# Patient Record
Sex: Female | Born: 1955
Health system: Southern US, Community
[De-identification: ages and names within clinical notes are randomized; demographics above are authoritative.]

## PROBLEM LIST (undated history)

## (undated) DIAGNOSIS — H269 Unspecified cataract: Secondary | ICD-10-CM

## (undated) DIAGNOSIS — E119 Type 2 diabetes mellitus without complications: Secondary | ICD-10-CM

## (undated) DIAGNOSIS — T7840XA Allergy, unspecified, initial encounter: Secondary | ICD-10-CM

## (undated) DIAGNOSIS — N200 Calculus of kidney: Secondary | ICD-10-CM

## (undated) DIAGNOSIS — G4700 Insomnia, unspecified: Secondary | ICD-10-CM

## (undated) DIAGNOSIS — K8020 Calculus of gallbladder without cholecystitis without obstruction: Secondary | ICD-10-CM

## (undated) DIAGNOSIS — M1990 Unspecified osteoarthritis, unspecified site: Secondary | ICD-10-CM

## (undated) DIAGNOSIS — K219 Gastro-esophageal reflux disease without esophagitis: Secondary | ICD-10-CM

## (undated) DIAGNOSIS — E079 Disorder of thyroid, unspecified: Secondary | ICD-10-CM

## (undated) DIAGNOSIS — E785 Hyperlipidemia, unspecified: Secondary | ICD-10-CM

## (undated) DIAGNOSIS — K5730 Diverticulosis of large intestine without perforation or abscess without bleeding: Secondary | ICD-10-CM

## (undated) HISTORY — DX: Insomnia, unspecified: G47.00

## (undated) HISTORY — DX: Unspecified cataract: H26.9

## (undated) HISTORY — DX: Gastro-esophageal reflux disease without esophagitis: K21.9

## (undated) HISTORY — DX: Diverticulosis of large intestine without perforation or abscess without bleeding: K57.30

## (undated) HISTORY — DX: Calculus of gallbladder without cholecystitis without obstruction: K80.20

## (undated) HISTORY — DX: Allergy, unspecified, initial encounter: T78.40XA

## (undated) HISTORY — DX: Type 2 diabetes mellitus without complications: E11.9

## (undated) HISTORY — DX: Disorder of thyroid, unspecified: E07.9

## (undated) HISTORY — PX: ABDOMINAL HYSTERECTOMY: SHX81

## (undated) HISTORY — PX: TONSILLECTOMY AND ADENOIDECTOMY: SUR1326

## (undated) HISTORY — DX: Unspecified osteoarthritis, unspecified site: M19.90

## (undated) HISTORY — DX: Hyperlipidemia, unspecified: E78.5

## (undated) HISTORY — DX: Calculus of kidney: N20.0

## (undated) HISTORY — PX: COLONOSCOPY: SHX174

## (undated) HISTORY — PX: CATARACT EXTRACTION: SUR2

---

## 1994-11-26 HISTORY — PX: OTHER SURGICAL HISTORY: SHX169

## 1999-08-25 ENCOUNTER — Other Ambulatory Visit: Admission: RE | Admit: 1999-08-25 | Discharge: 1999-08-25 | Payer: Self-pay | Admitting: Gynecology

## 1999-12-14 ENCOUNTER — Encounter: Admission: RE | Admit: 1999-12-14 | Discharge: 1999-12-14 | Payer: Self-pay | Admitting: Gynecology

## 1999-12-14 ENCOUNTER — Encounter: Payer: Self-pay | Admitting: Gynecology

## 1999-12-18 ENCOUNTER — Encounter: Payer: Self-pay | Admitting: Gynecology

## 1999-12-18 ENCOUNTER — Encounter: Admission: RE | Admit: 1999-12-18 | Discharge: 1999-12-18 | Payer: Self-pay | Admitting: Gynecology

## 2000-12-28 ENCOUNTER — Encounter: Payer: Self-pay | Admitting: Gynecology

## 2000-12-28 ENCOUNTER — Encounter: Admission: RE | Admit: 2000-12-28 | Discharge: 2000-12-28 | Payer: Self-pay | Admitting: Gynecology

## 2000-12-30 ENCOUNTER — Encounter: Payer: Self-pay | Admitting: Gynecology

## 2000-12-30 ENCOUNTER — Encounter: Admission: RE | Admit: 2000-12-30 | Discharge: 2000-12-30 | Payer: Self-pay | Admitting: Gynecology

## 2001-01-09 ENCOUNTER — Other Ambulatory Visit: Admission: RE | Admit: 2001-01-09 | Discharge: 2001-01-09 | Payer: Self-pay | Admitting: Gynecology

## 2002-01-05 ENCOUNTER — Encounter: Admission: RE | Admit: 2002-01-05 | Discharge: 2002-01-05 | Payer: Self-pay | Admitting: Gynecology

## 2002-01-05 ENCOUNTER — Encounter: Payer: Self-pay | Admitting: Gynecology

## 2002-01-15 ENCOUNTER — Other Ambulatory Visit: Admission: RE | Admit: 2002-01-15 | Discharge: 2002-01-15 | Payer: Self-pay | Admitting: Gynecology

## 2003-01-10 ENCOUNTER — Encounter: Admission: RE | Admit: 2003-01-10 | Discharge: 2003-01-10 | Payer: Self-pay | Admitting: Gynecology

## 2003-01-10 ENCOUNTER — Encounter: Payer: Self-pay | Admitting: Gynecology

## 2003-01-28 ENCOUNTER — Other Ambulatory Visit: Admission: RE | Admit: 2003-01-28 | Discharge: 2003-01-28 | Payer: Self-pay | Admitting: Gynecology

## 2004-03-24 ENCOUNTER — Encounter: Admission: RE | Admit: 2004-03-24 | Discharge: 2004-03-24 | Payer: Self-pay | Admitting: Gynecology

## 2004-08-11 ENCOUNTER — Other Ambulatory Visit: Admission: RE | Admit: 2004-08-11 | Discharge: 2004-08-11 | Payer: Self-pay | Admitting: Gynecology

## 2005-01-14 ENCOUNTER — Other Ambulatory Visit: Admission: RE | Admit: 2005-01-14 | Discharge: 2005-01-14 | Payer: Self-pay | Admitting: Gynecology

## 2005-02-25 HISTORY — PX: OTHER SURGICAL HISTORY: SHX169

## 2005-04-07 ENCOUNTER — Ambulatory Visit (HOSPITAL_BASED_OUTPATIENT_CLINIC_OR_DEPARTMENT_OTHER): Admission: RE | Admit: 2005-04-07 | Discharge: 2005-04-07 | Payer: Self-pay | Admitting: Orthopedic Surgery

## 2005-04-07 ENCOUNTER — Ambulatory Visit (HOSPITAL_COMMUNITY): Admission: RE | Admit: 2005-04-07 | Discharge: 2005-04-07 | Payer: Self-pay | Admitting: Orthopedic Surgery

## 2005-08-24 ENCOUNTER — Other Ambulatory Visit: Admission: RE | Admit: 2005-08-24 | Discharge: 2005-08-24 | Payer: Self-pay | Admitting: Gynecology

## 2005-09-28 ENCOUNTER — Encounter: Admission: RE | Admit: 2005-09-28 | Discharge: 2005-09-28 | Payer: Self-pay | Admitting: Gynecology

## 2005-10-06 ENCOUNTER — Encounter: Payer: Self-pay | Admitting: Gastroenterology

## 2006-03-07 ENCOUNTER — Other Ambulatory Visit: Admission: RE | Admit: 2006-03-07 | Discharge: 2006-03-07 | Payer: Self-pay | Admitting: Gynecology

## 2006-05-06 ENCOUNTER — Ambulatory Visit: Payer: Self-pay | Admitting: Family Medicine

## 2006-09-05 ENCOUNTER — Ambulatory Visit: Payer: Self-pay | Admitting: Family Medicine

## 2006-09-12 ENCOUNTER — Other Ambulatory Visit: Admission: RE | Admit: 2006-09-12 | Discharge: 2006-09-12 | Payer: Self-pay | Admitting: Gynecology

## 2006-11-04 ENCOUNTER — Encounter: Admission: RE | Admit: 2006-11-04 | Discharge: 2006-11-04 | Payer: Self-pay | Admitting: Gynecology

## 2007-07-26 DIAGNOSIS — R87619 Unspecified abnormal cytological findings in specimens from cervix uteri: Secondary | ICD-10-CM | POA: Insufficient documentation

## 2007-07-26 DIAGNOSIS — E785 Hyperlipidemia, unspecified: Secondary | ICD-10-CM | POA: Insufficient documentation

## 2007-07-26 DIAGNOSIS — K5730 Diverticulosis of large intestine without perforation or abscess without bleeding: Secondary | ICD-10-CM | POA: Insufficient documentation

## 2007-08-10 ENCOUNTER — Ambulatory Visit: Payer: Self-pay | Admitting: Family Medicine

## 2007-08-10 LAB — CONVERTED CEMR LAB
ALT: 18 units/L (ref 0–35)
AST: 18 units/L (ref 0–37)
Albumin: 3.9 g/dL (ref 3.5–5.2)
Alkaline Phosphatase: 91 units/L (ref 39–117)
BUN: 12 mg/dL (ref 6–23)
HDL: 33.5 mg/dL — ABNORMAL LOW (ref >39.0)
Potassium: 4.3 meq/L (ref 3.5–5.1)
Total Bilirubin: 0.8 mg/dL (ref 0.3–1.2)
VLDL: 27 mg/dL (ref 0–40)

## 2007-08-17 ENCOUNTER — Ambulatory Visit: Payer: Self-pay | Admitting: Family Medicine

## 2007-11-07 ENCOUNTER — Ambulatory Visit: Payer: Self-pay | Admitting: Family Medicine

## 2007-11-17 ENCOUNTER — Encounter: Admission: RE | Admit: 2007-11-17 | Discharge: 2007-11-17 | Payer: Self-pay | Admitting: Gynecology

## 2007-12-28 ENCOUNTER — Ambulatory Visit: Payer: Self-pay | Admitting: Family Medicine

## 2008-08-02 ENCOUNTER — Encounter: Payer: Self-pay | Admitting: Family Medicine

## 2008-09-10 ENCOUNTER — Ambulatory Visit: Payer: Self-pay | Admitting: Family Medicine

## 2008-09-10 DIAGNOSIS — J019 Acute sinusitis, unspecified: Secondary | ICD-10-CM | POA: Insufficient documentation

## 2008-11-19 ENCOUNTER — Encounter: Admission: RE | Admit: 2008-11-19 | Discharge: 2008-11-19 | Payer: Self-pay | Admitting: Gynecology

## 2008-12-25 ENCOUNTER — Ambulatory Visit: Payer: Self-pay | Admitting: Family Medicine

## 2009-12-26 ENCOUNTER — Encounter: Admission: RE | Admit: 2009-12-26 | Discharge: 2009-12-26 | Payer: Self-pay | Admitting: Gynecology

## 2010-04-30 ENCOUNTER — Encounter (INDEPENDENT_AMBULATORY_CARE_PROVIDER_SITE_OTHER): Payer: Self-pay | Admitting: *Deleted

## 2010-10-15 ENCOUNTER — Encounter: Payer: Self-pay | Admitting: Gastroenterology

## 2010-10-29 NOTE — Letter (Signed)
Summary: Sharon Benjamin letter  [facility]  [address] [place], [place] [address]   Phone: [phone]  Fax: [phone]       04/30/2010 MRN: [id number]  Sharon Benjamin [address] [place], [place]  [address]  Dear Ms. [name] [place], and [place] announce the retirement of Arta Silence, M.D., from full-time practice at the [facility] office effective March 26, 2010 and his plans of returning part-time.  It is important to Dr. Hetty Ely and to our practice that you understand that [facility] has seven physicians in our office for your health care needs.  We will continue to offer the same exceptional care that you have today.    Dr. Hetty Ely has spoken to many of you about his plans for retirement and returning part-time in the fall.   We will continue to work with you through the transition to schedule appointments for you in the office and meet the high standards that [place] is committed to.   Again, it is with great pleasure that we share the news that Dr. Hetty Ely will return to [facility] in October of 2011 with a reduced schedule.    If you have any questions, or would like to request an appointment with one of our physicians, please call us at [phone] and press the option for Scheduling an appointment.  We take pleasure in providing you with excellent patient care and look forward to seeing you at your next office visit.  Our [facility] Physicians are:  Tillman Abide, M.D. Laurita Quint, M.D. Roxy Manns, M.D. Kerby Nora, M.D. Hannah Beat, M.D. Ruthe Mannan, M.D. We proudly welcomed Raechel Ache, M.D. and Eustaquio Boyden, M.D. to the practice in July/August 2011.  Sincerely,  [facility]

## 2010-10-29 NOTE — Letter (Signed)
Summary: Pre Visit Letter Revised  [facility]  [address] [place], [place] [address]   Phone: [phone]  Fax: [phone]        10/15/2010 MRN: [id number]  Sharon Benjamin [address] [place], [place]  [address]             Procedure Date:  11-30-10 9:30am           Dr Russella Dar  - Recall Colon   Welcome to the Gastroenterology Division at [facility].    You are scheduled to see a nurse for your pre-procedure visit on 11-13-10 at 10am on the 3rd floor at [facility], [address]  We ask that you try to arrive at our office 15 minutes prior to your appointment time to allow for check-in.  Please take a minute to review the attached form.  If you answer "Yes" to one or more of the questions on the first page, we ask that you call the person listed at your earliest opportunity.  If you answer "No" to all of the questions, please complete the rest of the form and bring it to your appointment.    Your nurse visit will consist of discussing your medical and surgical history, your immediate family medical history, and your medications.   If you are unable to list all of your medications on the form, please bring the medication bottles to your appointment and we will list them.  We will need to be aware of both prescribed and over the counter drugs.  We will need to know exact dosage information as well.    Please be prepared to read and sign documents such as consent forms, a financial agreement, and acknowledgement forms.  If necessary, and with your consent, a friend or relative is welcome to sit-in on the nurse visit with you.  Please bring your insurance card so that we may make a copy of it.  If your insurance requires a referral to see a specialist, please bring your referral form from your primary care physician.  No co-pay is required for this nurse visit.     If you cannot keep your appointment, please call [phone] to cancel or reschedule prior  to your appointment date.  This allows [place] the opportunity to schedule an appointment for another patient in need of care.    Thank you for choosing [facility] for your medical needs.  We appreciate the opportunity to care for you.  Please visit [place] at our website  to learn more about our practice.  Sincerely, The Gastroenterology Division

## 2010-11-12 ENCOUNTER — Encounter (INDEPENDENT_AMBULATORY_CARE_PROVIDER_SITE_OTHER): Payer: Self-pay | Admitting: *Deleted

## 2010-11-13 ENCOUNTER — Encounter (INDEPENDENT_AMBULATORY_CARE_PROVIDER_SITE_OTHER): Payer: Self-pay | Admitting: *Deleted

## 2010-11-18 NOTE — Letter (Signed)
Summary: Moviprep Instructions  [facility]  [address]   [place], [place] [address]   Phone: [phone]  Fax: [phone]       Sharon Benjamin    [birth year]    MRN: [id number]        Procedure Day /Date: Monday, 11-30-10     Arrival Time: 8:30 a.m.     Procedure Time: 9:30 a.m.     Location of Procedure:                    x  [facility] (4th Floor)  PREPARATION FOR COLONOSCOPY WITH MOVIPREP   Starting 5 days prior to your procedure 11-25-10 do not eat nuts, seeds, popcorn, corn, beans, peas,  salads, or any raw vegetables.  Do not take any fiber supplements (e.g. Metamucil, Citrucel, and Benefiber).  THE DAY BEFORE YOUR PROCEDURE         DATE: 11-29-10  DAY: Sunday  1.  Drink clear liquids the entire day-NO SOLID FOOD  2.  Do not drink anything colored red or purple.  Avoid juices with pulp.  No orange juice.  3.  Drink at least 64 oz. (8 glasses) of fluid/clear liquids during the day to prevent dehydration and help the prep work efficiently.  CLEAR LIQUIDS INCLUDE: Water Jello Ice Popsicles Tea (sugar ok, no milk/cream) Powdered fruit flavored drinks Coffee (sugar ok, no milk/cream) Gatorade Juice: apple, white grape, white cranberry  Lemonade Clear bullion, consomm, broth Carbonated beverages (any kind) Strained chicken noodle soup Hard Candy                             4.  In the morning, mix first dose of MoviPrep solution:    Empty 1 Pouch A and 1 Pouch B into the disposable container    Add lukewarm drinking water to the top line of the container. Mix to dissolve    Refrigerate (mixed solution should be used within 24 hrs)  5.  Begin drinking the prep at 5:00 p.m. The MoviPrep container is divided by 4 marks.   Every 15 minutes drink the solution down to the next mark (approximately 8 oz) until the full liter is complete.   6.  Follow completed prep with 16 oz of clear liquid of your choice (Nothing red or purple).   Continue to drink clear liquids until bedtime.  7.  Before going to bed, mix second dose of MoviPrep solution:    Empty 1 Pouch A and 1 Pouch B into the disposable container    Add lukewarm drinking water to the top line of the container. Mix to dissolve    Refrigerate  THE DAY OF YOUR PROCEDURE      DATE: 11-30-10 DAY: Monday  Beginning at 4:30 a.m. (5 hours before procedure):         1. Every 15 minutes, drink the solution down to the next mark (approx 8 oz) until the full liter is complete.  2. Follow completed prep with 16 oz. of clear liquid of your choice.    3. You may drink clear liquids until 7:30 a.m. (2 HOURS BEFORE PROCEDURE).   MEDICATION INSTRUCTIONS  Unless otherwise instructed, you should take regular prescription medications with a small sip of water   as early as possible the morning of your procedure.       OTHER INSTRUCTIONS  You will need a responsible adult at least 57 years of age  to accompany you and drive you home.   This person must remain in the waiting room during your procedure.  Wear loose fitting clothing that is easily removed.  Leave jewelry and other valuables at home.  However, you may wish to bring a book to read or  an iPod/MP3 player to listen to music as you wait for your procedure to start.  Remove all body piercing jewelry and leave at home.  Total time from sign-in until discharge is approximately 2-3 hours.  You should go home directly after your procedure and rest.  You can resume normal activities the  day after your procedure.  The day of your procedure you should not:   Drive   Make legal decisions   Operate machinery   Drink alcohol   Return to work  You will receive specific instructions about eating, activities and medications before you leave.    The above instructions have been reviewed and explained to me by   Durwin Glaze RN  November 13, 2010 10:16 AM    I fully understand and can verbalize these  instructions _____________________________ Date _________

## 2010-11-18 NOTE — Miscellaneous (Signed)
Summary: [facility] PV/[facility]  Clinical Lists Changes  Medications: Added new medication of MOVIPREP 100 GM  SOLR (PEG-KCL-NACL-NASULF-NA ASC-C) As per prep instructions. - Signed Rx of MOVIPREP 100 GM  SOLR (PEG-KCL-NACL-NASULF-NA ASC-C) As per prep instructions.;  #1 x 0;  Signed;  Entered by: Durwin Glaze RN;  Authorized by: Meryl Dare MD [facility];  Method used: Print then Give to Patient Observations: Added new observation of NKA: T (11/13/2010 10:15)    Prescriptions: MOVIPREP 100 GM  SOLR (PEG-KCL-NACL-NASULF-NA ASC-C) As per prep instructions.  #1 x 0   Entered by:   Durwin Glaze RN   Authorized by:   Meryl Dare MD [facility]   Signed by:   Durwin Glaze RN on 11/13/2010   Method used:   Print then Give to Patient   RxID:   [id number]

## 2010-11-18 NOTE — Miscellaneous (Signed)
Summary: NO BLD THNRS COLON 3-5 9:30AM MONDAY LAST COLON 42YRS AGO W/DR...  Clinical Lists Changes

## 2010-11-24 ENCOUNTER — Telehealth (INDEPENDENT_AMBULATORY_CARE_PROVIDER_SITE_OTHER): Payer: Self-pay

## 2010-11-30 ENCOUNTER — Other Ambulatory Visit: Payer: Self-pay | Admitting: Gastroenterology

## 2010-12-03 NOTE — Procedures (Signed)
Summary: Colonoscopy/[facility]  Colonoscopy/[facility]   Imported By: Sherian Rein 11/27/2010 06:35:00  _____________________________________________________________________  External Attachment:    Type:   Image     Comment:   External Document

## 2010-12-03 NOTE — Progress Notes (Signed)
Summary: Colon recall ?  Phone Note Other Incoming   Summary of Call: Patient was here for a pre-visit.  She had a colon 10/06/05 with Dr Kinnie Scales.  He sent her a recall letter this year, due to insurance she has come here.  Please review records and advise if colon scheduled for 3/15 is still appropriate.   Initial call taken by: Darcey Nora RN, CGRN,  November 24, 2010 11:01 AM  Follow-up for Phone Call        Records on my desk? Can't find any records in EMR? Follow-up by: Meryl Dare MD Clementeen Graham,  November 24, 2010 11:34 AM  Additional Follow-up for Phone Call Additional follow up Details #1::        yes they are on paper on your desk. Darcey Nora RN, [facility]  November 24, 2010 11:37 AM  I spoke with the patient and she has no family hx or symproms.  She is aware that colon is due 09/2015.  I have canceled her appointment for colon and entered future recal;l. Additional Follow-up by: Darcey Nora RN, CGRN,  November 25, 2010 11:06 AM    Additional Follow-up for Phone Call Additional follow up Details #2::    10/06/05 colonoscopy report shows no elevated risk factors and there were no polyps noted. If she does not have elevated risk factors or other GI symptoms at this time, a 10 year recall for 09/2015 would be indicated.. Follow-up by: Meryl Dare MD Clementeen Graham,  November 24, 2010 5:19 PM

## 2010-12-04 ENCOUNTER — Other Ambulatory Visit: Payer: Self-pay | Admitting: Gynecology

## 2010-12-04 DIAGNOSIS — Z1231 Encounter for screening mammogram for malignant neoplasm of breast: Secondary | ICD-10-CM

## 2011-01-08 ENCOUNTER — Ambulatory Visit
Admission: RE | Admit: 2011-01-08 | Discharge: 2011-01-08 | Disposition: A | Discharge status: Home / Self Care | Payer: Managed Care, Other (non HMO) | Source: Ambulatory Visit | Attending: Gynecology | Admitting: Gynecology

## 2011-01-08 DIAGNOSIS — Z1231 Encounter for screening mammogram for malignant neoplasm of breast: Secondary | ICD-10-CM

## 2011-02-12 NOTE — Assessment & Plan Note (Signed)
[facility]                             [place] OFFICE NOTE   Sharon Benjamin                     MRN:          [id number]  DATE:05/06/2006                            DOB:          1956/09/16    [name] is a 55 year old white female who comes to establish with the  practice having previously seen Dr. Karma Ganja who has retired.  She sees  Dr. Nicholas Lose in [place], [place].   PROBLEMS:  She comes for two problems.  1. The first being a right foot pain time 3-4 days with no trauma.  It      hurts down the lateral side of the foot with some slight amount of      swelling.  She has been taking nothing.  2. The second problem is her tail bone hurts for the past two months.      She again has had no trauma.   CURRENT MEDICATIONS:  1. Zetia 10 mg daily.  2. Ambien CR 6.25 mg at h.s. p.r.n.  3. Vivelle patch 0.05 mg.   ALLERGIES:  No known.   PAST MEDICAL HISTORY:  1. Significant for hyperlipidemia.  Note she did not tolerate Lipitor.  2. Abnormal Pap.  Now is having Pap's every six months as followed by Dr.      Nicholas Lose.  3. She is on hormone replacement.  4. She has had no measles, mumps or chickenpox.  5. She has diverticulosis and had a renal stone about 1990.   SURGERIES:  Surgeries having included left knee arthroscopic by Dr. Eulah Pont  and a T&A at age 69.   HOSPITALIZATIONS:  She has been hospitalized for surgery only.   SOCIAL HISTORY:  She is married without children.  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  She indicates that she does not smoke.  Seldom uses  alcohol.   REVIEW OF SYSTEMS:  Negative including HENT, cardiovascular, respiratory.  She does wear contacts and glasses for near-sightedness.  Last eye exam was  less than a year ago with no glaucoma, cataracts and no new prescriptions.  GI:  She indicates that she has occasional constipation but controlled with  fruit and fiber.  She has occasional  reflux and she uses Ranitidine on a  p.r.n. basis.  She had a colonoscopy in January of 2007 at which time  diverticulosis was found.  This was done by Dr. Kinnie Scales.  GU:  Renal stones  as noted before 1990 with a negative IVP.  She has had no recurrence.  She  has had remote urinary tract infections.  Gynecologically, she has been  nullipara.  Last Pap smear was approximately a year ago and her mammogram  she indicates is current and is negative.  MUSCULOSKELETAL:  She has pain  in her left knee and arthritis in her hands and knees.  She has had no  thromboses but does have allergic rhinitis in the spring and fall.   FAMILY HISTORY:  Father died at age 77 with carcinoma of the esophagus.  He  was a smoker and used  alcohol.  Mother died at age 25, complications of  pneumonia.  She had hyperlipidemia, hypertension, asthma and psoriatic  arthritis.  She had one brother who died at age 31 in a motor vehicle  accident.  She has one sister living who has varicosities.   With questions regarding the extended family on that there is angina on the  mother's side of the family and a maternal uncle had congestive heart  failure and her maternal grandmother died of an MI.  Her paternal  grandmother has diabetes.  A paternal aunt had breast cancer and a maternal  aunt had ovarian cancer.  To her knowledge there is no colon or prostate  cancer in the family.  There paternal aunt had liver cancer and a paternal  uncle x2 have lung cancer.   IMMUNIZATION RECORDS:  She is unsure of her DT status.  She has not had the  hepatitis C and does not do the flu vaccine.   PHYSICAL EXAMINATION:  VITAL SIGNS:  Blood pressure  158/98, temperature  98.4, pulse is 76.  Weight is 206.  Height 5 feet 5 inches.  GENERAL:  Obese, white female in no acute distress.  CHEST:  Clear throughout.  No rales, rhonchi or wheezes.  HEART:  Rate and rhythm regular without murmurs, gallops or rubs, no  pretibial edema.   MUSCULOSKELETAL:  Muscle masses equal throughout upper and lower  extremities.  Gait and station within normal limits.  Right foot -- she is  tender along the lateral right foot.  No erythema, edema or ecchymosis. X-  ray was negative.  SACRAL/COCCYX AREA:  Exam where her pain indicated, she is tender slightly  over the lower sacral area with no erythema or edema noted in the skin  tissue.  SKIN:  Skin is as above and otherwise without lesions.  PSYCHIATRIC:  Oriented x3.  Verbalizes without difficulty.   ASSESSMENT:  1. Pain in the lower sacral area.  Question early pilonidal cyst.  Have      asked her to keep the area clean and dry and see back if she has      increased pain or swelling, as this may need surgical intervention.  2. Right lateral foot pain.  As the x-ray appears normal to me, I have      asked her to use ibuprofen 400-800 every 4-8 hours with food and we      will refer her if this continues.  3. Obesity.  This was not discussed in detail today.  4. We will get her old records and update her chart as needed.                                   Billie D. Bean, FNP                                Marne A. Tower, MD   BDB/MedQ  DD:  05/09/2006  DT:  05/09/2006  Job #:  [id number]

## 2011-02-12 NOTE — Op Note (Signed)
NAMEVIANNA, VENEZIA              ACCOUNT NO.:  [id number]   MEDICAL RECORD NO.:  [id number]          PATIENT TYPE:  AMB   LOCATION:  [facility]                          FACILITY:  [facility]   PHYSICIAN:  Loreta Ave, M.D. DATE OF BIRTH:  1956-04-13   DATE OF PROCEDURE:  04/08/2005  DATE OF DISCHARGE:                                 OPERATIVE REPORT   PREOPERATIVE DIAGNOSIS:  Left knee lateral meniscus tear.   POSTOPERATIVE DIAGNOSIS:  Left knee lateral meniscus tear with grade 2, mild  grade 3 chondromalacia, lateral patella facet and lateral tibial plateau.   OPERATION/PROCEDURE:  1.  Left knee examination under anesthesia.  2.  Arthroscopy.  3.  Partial lateral meniscectomy and chondroplasty of the lateral tibial      plateau and lateral patella.   SURGEON:  Loreta Ave, M.D.   ASSISTANT:  Genene Churn. Owens, P.A.-C.   ANESTHESIA:  Knee block with sedation.   SPECIMENS:  None.   CULTURES:  None.   COMPLICATIONS:  None.   DRESSINGS:  Soft compressive.   DESCRIPTION OF PROCEDURE:  The patient was brought to the operating room and  after adequate anesthesia had been obtained, left knee was examined.  Full  motion, stable ligaments.  Some lateral tracking and crepitus patellofemoral  joint but not significant tethering.  Full motion.  Tourniquet and leg  holder applied.  Leg prepped and draped in the usual sterile fashion.  Three  portals created.  Once superolateral, one each medial and lateral  parapatellar.  The inflow catheter induced.  Knee distended, arthroscope  introduced and the knee inspected.  Reasonable patellofemoral tracking, a  little lateral but no tethering.  Mild grade 3 chondromalacia, lateral facet  of the patella, treated with chondroplasty.  Fortunately not a lot of loss  articular cartilage.  Some small areas of focal grade 2 and 3 changes  in  the middle of the trochlea contacting the patella at 90 degrees.  Also  debrided.  Remaining knee examined.   Medial meniscus and medical compartment  intact with the exception of a little fraying of the free edge of the medial  meniscus was easily debrided and really did not represent a significant  tear.  Cruciate ligaments intact.  Laterally there were diffuse grade 2 and  3 changes on the plateau debrided.  Relatively extensive tearing of the  lateral meniscus, almost the entire anterior third with further flap tears  in the posterior attachment.  _________ out removing most of the anterior  half of the meniscus which was torn.  __________ remaining meniscus.  Flap  tear in the back debrided.  Retained about half the meniscus at completion.  Entire knee reexamined to be sure all loose  bodies were removed.  Instruments and fluid removed.  Portals of the knee  injected with Marcaine.  Portals closed with 4-0 nylon.  Sterile compressive  dressing was applied.  Anesthesia reversed.  Brought to the recovery room.  Tolerated the surgery well without complications.       DFM/MEDQ  D:  04/08/2005  T:  04/08/2005  Job:  [id number] 

## 2011-08-12 ENCOUNTER — Ambulatory Visit (INDEPENDENT_AMBULATORY_CARE_PROVIDER_SITE_OTHER): Payer: Managed Care, Other (non HMO) | Admitting: Family Medicine

## 2011-08-12 ENCOUNTER — Encounter: Payer: Self-pay | Admitting: Family Medicine

## 2011-08-12 DIAGNOSIS — J209 Acute bronchitis, unspecified: Secondary | ICD-10-CM | POA: Insufficient documentation

## 2011-08-12 MED ORDER — AZITHROMYCIN 250 MG PO TABS: ORAL_TABLET | ORAL | Status: AC

## 2011-08-12 NOTE — Assessment & Plan Note (Signed)
See instructions

## 2011-08-12 NOTE — Progress Notes (Signed)
  Subjective:    Patient ID: Sharon Benjamin, female    DOB: 21-Aug-1956, 55 y.o.   MRN: [id number]  HPI Pt not seen here in years here as acute appt for congestion, fever and cough. She sees Dr Nicholas Lose regularly and he prescribes her cholesterol medication. Her chol and liver function was checked a couple weeks ago. She has had fever to 101. Last night and took Tyl, none this AM. She has headache globally, even in her jaw and teeth. No ear pain but they itch. She has some ear drainage, no rhinitis but nasal congestion, no real ST except when coughing, coughing often productive of green thick mucous, no N/V, no diarrhea, no achiness.  She has taken tyl and has been using mucous relief expectorant, 1 a day for a few days She is exposed to smoke, does not smoke herself..     Review of SystemsNoncontributory except as above.       Objective:   Physical Exam  Constitutional: She appears well-developed and well-nourished. No distress.       Congested.  HENT:  Head: Normocephalic and atraumatic.  Right Ear: External ear normal.  Left Ear: External ear normal.  Mouth/Throat: Oropharynx is clear and moist. No oropharyngeal exudate.       Nose slightly inflamed with clear mucous.  Eyes: Conjunctivae and EOM are normal. Pupils are equal, round, and reactive to light.  Neck: Normal range of motion. Neck supple. No thyromegaly present.  Cardiovascular: Normal rate, regular rhythm and normal heart sounds.   Pulmonary/Chest: Effort normal and breath sounds normal. She has no wheezes. She has no rales.       Cough with airflow and mild ronchi bilaterally on exhalation.  Lymphadenopathy:    She has no cervical adenopathy.  Skin: She is not diaphoretic.          Assessment & Plan:

## 2011-08-12 NOTE — Patient Instructions (Signed)
Take Zithro. Take Guaifenesin (400mg ), take 11/2 tabs by mouth AM and NOON. Get GUAIFENESIN by  going to [facility] or [facility] and getting MUCOUS RELIEF EXPECTORANT/CONGESTION. DO NOT GET MUCINEX (Timed Release Guaifenesin)  Drink fluids. Gargle with 30ccs of warm salt water every half hour for 2 days as able. Take Advil 2 tabs after each meal for a few days. Keep lozenge in mouth all day.  Take Delsym at night as needed.

## 2011-10-28 ENCOUNTER — Ambulatory Visit (INDEPENDENT_AMBULATORY_CARE_PROVIDER_SITE_OTHER): Payer: Managed Care, Other (non HMO) | Admitting: Family Medicine

## 2011-10-28 ENCOUNTER — Encounter: Payer: Self-pay | Admitting: Family Medicine

## 2011-10-28 VITALS — BP 142/72 | HR 96 | Temp 98.4°F | Wt 218.0 lb

## 2011-10-28 DIAGNOSIS — J329 Chronic sinusitis, unspecified: Secondary | ICD-10-CM | POA: Insufficient documentation

## 2011-10-28 MED ORDER — AMOXICILLIN 875 MG PO TABS: 875.0000 mg | ORAL_TABLET | Freq: Two times a day (BID) | ORAL | Status: AC

## 2011-10-28 NOTE — Progress Notes (Signed)
duration of symptoms: last few days, started with burning in nose rhinorrhea:yes congestion:yes ear pain: no pain but itchy sore throat: scratchy Cough: minimal, clearing throat Myalgias: no other concerns: +facial pain and sore upper teeth, no fevers.   ROS: See HPI.  Otherwise negative.    Meds, vitals, and allergies reviewed.   GEN: nad, alert and oriented HEENT: mucous membranes moist, TM w/o erythema, nasal epithelium injected, OP with cobblestoning, max sinus ttp x2 NECK: supple w/o LA CV: rrr. PULM: ctab, no inc wob ABD: soft, +bs EXT: no edema

## 2011-10-28 NOTE — Patient Instructions (Signed)
Nasal saline and rest for next few days.  Use amoxil if not improved.  Take care.

## 2011-10-28 NOTE — Assessment & Plan Note (Addendum)
Short duration, nontoxic.  Use nasal saline and rest for next few days.  Use amoxil if not improved.  She agrees.  F/u prn.

## 2011-11-10 ENCOUNTER — Other Ambulatory Visit: Payer: Self-pay | Admitting: Family Medicine

## 2011-11-10 ENCOUNTER — Encounter: Payer: Self-pay | Admitting: Family Medicine

## 2011-11-10 ENCOUNTER — Telehealth: Payer: Self-pay | Admitting: Family Medicine

## 2011-11-10 DIAGNOSIS — H02849 Edema of unspecified eye, unspecified eyelid: Secondary | ICD-10-CM | POA: Insufficient documentation

## 2011-11-10 NOTE — Telephone Encounter (Signed)
Call pt.  Eye clinic rec'd checking ANA due to eyelid swelling.  Orders are in.  Needs to come for nonfasting lab visit.  Thanks.

## 2011-11-11 NOTE — Telephone Encounter (Signed)
LMOVM at home and work #'s to return call.

## 2011-11-11 NOTE — Telephone Encounter (Signed)
Appt 11/16/11 at 8:00.

## 2011-11-16 ENCOUNTER — Other Ambulatory Visit (INDEPENDENT_AMBULATORY_CARE_PROVIDER_SITE_OTHER): Payer: Managed Care, Other (non HMO)

## 2011-11-16 DIAGNOSIS — H02849 Edema of unspecified eye, unspecified eyelid: Secondary | ICD-10-CM

## 2011-12-22 ENCOUNTER — Other Ambulatory Visit: Payer: Self-pay | Admitting: Gynecology

## 2011-12-22 DIAGNOSIS — Z1231 Encounter for screening mammogram for malignant neoplasm of breast: Secondary | ICD-10-CM

## 2012-01-21 ENCOUNTER — Ambulatory Visit
Admission: RE | Admit: 2012-01-21 | Discharge: 2012-01-21 | Disposition: A | Discharge status: Home / Self Care | Payer: Managed Care, Other (non HMO) | Source: Ambulatory Visit | Attending: Gynecology | Admitting: Gynecology

## 2012-01-21 DIAGNOSIS — Z1231 Encounter for screening mammogram for malignant neoplasm of breast: Secondary | ICD-10-CM

## 2012-08-18 ENCOUNTER — Other Ambulatory Visit: Payer: Self-pay | Admitting: Internal Medicine

## 2012-08-18 DIAGNOSIS — E0590 Thyrotoxicosis, unspecified without thyrotoxic crisis or storm: Secondary | ICD-10-CM

## 2012-08-18 DIAGNOSIS — E049 Nontoxic goiter, unspecified: Secondary | ICD-10-CM

## 2012-08-29 ENCOUNTER — Encounter: Payer: Self-pay | Admitting: Family Medicine

## 2012-08-29 ENCOUNTER — Ambulatory Visit (INDEPENDENT_AMBULATORY_CARE_PROVIDER_SITE_OTHER): Payer: Managed Care, Other (non HMO) | Admitting: Family Medicine

## 2012-08-29 VITALS — BP 162/82 | HR 100 | Temp 100.1°F | Wt 225.0 lb

## 2012-08-29 DIAGNOSIS — R059 Cough, unspecified: Secondary | ICD-10-CM

## 2012-08-29 DIAGNOSIS — E042 Nontoxic multinodular goiter: Secondary | ICD-10-CM

## 2012-08-29 DIAGNOSIS — R05 Cough: Secondary | ICD-10-CM

## 2012-08-29 MED ORDER — ALBUTEROL SULFATE HFA 108 (90 BASE) MCG/ACT IN AERS: 2.0000 | INHALATION_SPRAY | Freq: Four times a day (QID) | RESPIRATORY_TRACT | Status: DC | PRN

## 2012-08-29 MED ORDER — AZITHROMYCIN 250 MG PO TABS: ORAL_TABLET | ORAL | Status: DC

## 2012-08-29 NOTE — Patient Instructions (Addendum)
Use the inhaler for the cough and start the antibiotics today.  This should gradually get better.  Rest and fluids in the meantime.  Take care.

## 2012-08-29 NOTE — Progress Notes (Signed)
duration of symptoms: just under a week.  Rhinorrhea:yes congestion:yes ear pain:yes sore throat: scratchy but not sore Cough: yes, but no sputum.  Wheeze at night.  Myalgias: no Fever: yes She has a sore on the L side of her mouth  Took delsym for the cough.  No help with that.    ROS: See HPI.  Otherwise negative.    Meds, vitals, and allergies reviewed.   GEN: nad, alert and oriented HEENT: mucous membranes moist, TM w/o erythema, nasal epithelium injected, OP with cobblestoning NECK: supple w/ tender LA CV: rrr. PULM: diffuse rhonchi B but no wheeze, no inc wob ABD: soft, +bs EXT: no edema

## 2012-08-30 DIAGNOSIS — R05 Cough: Secondary | ICD-10-CM | POA: Insufficient documentation

## 2012-08-30 DIAGNOSIS — E042 Nontoxic multinodular goiter: Secondary | ICD-10-CM | POA: Insufficient documentation

## 2012-08-30 DIAGNOSIS — R059 Cough, unspecified: Secondary | ICD-10-CM | POA: Insufficient documentation

## 2012-08-30 NOTE — Assessment & Plan Note (Signed)
Given the exam, would treat with zithromax, rest and fluids.  Should resolve, f/u prn. She agrees.  Nontoxic.

## 2012-09-05 ENCOUNTER — Encounter (HOSPITAL_COMMUNITY): Admission: RE | Admit: 2012-09-05 | Payer: Managed Care, Other (non HMO) | Source: Ambulatory Visit

## 2012-09-06 ENCOUNTER — Encounter (HOSPITAL_COMMUNITY)
Admission: RE | Admit: 2012-09-06 | Discharge: 2012-09-06 | Disposition: A | Discharge status: Home / Self Care | Payer: Managed Care, Other (non HMO) | Source: Ambulatory Visit | Attending: Internal Medicine | Admitting: Internal Medicine

## 2012-09-06 ENCOUNTER — Encounter (HOSPITAL_COMMUNITY): Payer: Managed Care, Other (non HMO)

## 2012-09-06 DIAGNOSIS — E0510 Thyrotoxicosis with toxic single thyroid nodule without thyrotoxic crisis or storm: Secondary | ICD-10-CM | POA: Insufficient documentation

## 2012-09-06 DIAGNOSIS — E0590 Thyrotoxicosis, unspecified without thyrotoxic crisis or storm: Secondary | ICD-10-CM

## 2012-09-06 DIAGNOSIS — E049 Nontoxic goiter, unspecified: Secondary | ICD-10-CM

## 2012-09-07 ENCOUNTER — Encounter (HOSPITAL_COMMUNITY)
Admission: RE | Admit: 2012-09-07 | Discharge: 2012-09-07 | Disposition: A | Discharge status: Home / Self Care | Payer: Managed Care, Other (non HMO) | Source: Ambulatory Visit | Attending: Internal Medicine | Admitting: Internal Medicine

## 2012-09-07 MED ORDER — SODIUM IODIDE I 131 CAPSULE
8.5000 | Freq: Once | INTRAVENOUS | Status: AC | PRN
  Administered 2012-09-07: 8.5 via ORAL

## 2012-09-07 MED ORDER — SODIUM PERTECHNETATE TC 99M INJECTION
10.1000 | Freq: Once | INTRAVENOUS | Status: AC | PRN
  Administered 2012-09-07: 10.1 via INTRAVENOUS

## 2012-09-15 ENCOUNTER — Other Ambulatory Visit: Payer: Self-pay | Admitting: Internal Medicine

## 2012-09-15 DIAGNOSIS — E0590 Thyrotoxicosis, unspecified without thyrotoxic crisis or storm: Secondary | ICD-10-CM

## 2012-09-15 DIAGNOSIS — E0520 Thyrotoxicosis with toxic multinodular goiter without thyrotoxic crisis or storm: Secondary | ICD-10-CM

## 2012-09-29 ENCOUNTER — Encounter (HOSPITAL_COMMUNITY)
Admission: RE | Admit: 2012-09-29 | Discharge: 2012-09-29 | Disposition: A | Discharge status: Home / Self Care | Payer: Managed Care, Other (non HMO) | Source: Ambulatory Visit | Attending: Internal Medicine | Admitting: Internal Medicine

## 2012-09-29 DIAGNOSIS — E0520 Thyrotoxicosis with toxic multinodular goiter without thyrotoxic crisis or storm: Secondary | ICD-10-CM | POA: Insufficient documentation

## 2012-09-29 DIAGNOSIS — E0590 Thyrotoxicosis, unspecified without thyrotoxic crisis or storm: Secondary | ICD-10-CM

## 2012-09-29 MED ORDER — SODIUM IODIDE I 131 CAPSULE: 31.1000 | Freq: Once | INTRAVENOUS | Status: AC | PRN

## 2012-12-06 DIAGNOSIS — D219 Benign neoplasm of connective and other soft tissue, unspecified: Secondary | ICD-10-CM | POA: Insufficient documentation

## 2012-12-07 DIAGNOSIS — E669 Obesity, unspecified: Secondary | ICD-10-CM | POA: Insufficient documentation

## 2013-02-09 ENCOUNTER — Other Ambulatory Visit: Payer: Self-pay

## 2013-02-09 DIAGNOSIS — Z1231 Encounter for screening mammogram for malignant neoplasm of breast: Secondary | ICD-10-CM

## 2013-03-15 ENCOUNTER — Ambulatory Visit
Admission: RE | Admit: 2013-03-15 | Discharge: 2013-03-15 | Disposition: A | Discharge status: Home / Self Care | Payer: Managed Care, Other (non HMO) | Source: Ambulatory Visit

## 2013-03-15 ENCOUNTER — Encounter: Payer: Self-pay | Admitting: *Deleted

## 2013-03-15 DIAGNOSIS — Z1231 Encounter for screening mammogram for malignant neoplasm of breast: Secondary | ICD-10-CM

## 2013-09-07 ENCOUNTER — Ambulatory Visit (INDEPENDENT_AMBULATORY_CARE_PROVIDER_SITE_OTHER): Payer: Managed Care, Other (non HMO) | Admitting: Family Medicine

## 2013-09-07 ENCOUNTER — Encounter: Payer: Self-pay | Admitting: Family Medicine

## 2013-09-07 VITALS — BP 146/72 | HR 91 | Temp 99.0°F | Ht 65.75 in | Wt 232.8 lb

## 2013-09-07 DIAGNOSIS — J019 Acute sinusitis, unspecified: Secondary | ICD-10-CM

## 2013-09-07 DIAGNOSIS — E785 Hyperlipidemia, unspecified: Secondary | ICD-10-CM

## 2013-09-07 MED ORDER — AMOXICILLIN-POT CLAVULANATE 875-125 MG PO TABS: 1.0000 | ORAL_TABLET | Freq: Two times a day (BID) | ORAL | Status: DC

## 2013-09-07 MED ORDER — SIMVASTATIN 20 MG PO TABS: 20.0000 mg | ORAL_TABLET | Freq: Every day | ORAL | Status: DC

## 2013-09-07 NOTE — Assessment & Plan Note (Signed)
D/w pt. Nontoxic.  Would treat given the exam.  Start augmentin. F/u prn. She agrees.

## 2013-09-07 NOTE — Patient Instructions (Signed)
Get some rest.  Drink plenty of fluids.  Start the augmentin today and this should resolve.  Take care.

## 2013-09-07 NOTE — Assessment & Plan Note (Signed)
Refill done to last until her next CPE.  Given to patient.

## 2013-09-07 NOTE — Progress Notes (Signed)
Pre-visit discussion using our clinic review tool. No additional management support is needed unless otherwise documented below in the visit note.  Sx started about 1 week ago. Progressive in the meantime.  Voice is altered.  Scratchy throat. Postnasal gtt. Fevers starting about 2 days ago. B ear itching but not pain. Cough. Some sputum.  Facial and B upper tooth pain.  No vomiting, no diarrhea.  Possible sick contacts. Had been out of work today, working from home.   Meds, vitals, and allergies reviewed.   ROS: See HPI.  Otherwise, noncontributory.  GEN: nad, alert and oriented HEENT: mucous membranes moist, tm w/o erythema, nasal exam w/o erythema, clear discharge noted,  OP with cobblestoning, max sinuses ttp B NECK: supple w/o LA CV: rrr.   PULM: ctab, no inc wob EXT: no edema SKIN: no acute rash

## 2013-11-22 ENCOUNTER — Other Ambulatory Visit: Payer: Managed Care, Other (non HMO)

## 2013-11-26 ENCOUNTER — Other Ambulatory Visit: Payer: Managed Care, Other (non HMO)

## 2013-11-29 ENCOUNTER — Ambulatory Visit (INDEPENDENT_AMBULATORY_CARE_PROVIDER_SITE_OTHER): Payer: Managed Care, Other (non HMO) | Admitting: Family Medicine

## 2013-11-29 ENCOUNTER — Encounter: Payer: Self-pay | Admitting: Family Medicine

## 2013-11-29 VITALS — BP 152/84 | HR 80 | Temp 98.3°F | Ht 65.0 in | Wt 236.5 lb

## 2013-11-29 DIAGNOSIS — Z0000 Encounter for general adult medical examination without abnormal findings: Secondary | ICD-10-CM

## 2013-11-29 DIAGNOSIS — R05 Cough: Secondary | ICD-10-CM

## 2013-11-29 DIAGNOSIS — E785 Hyperlipidemia, unspecified: Secondary | ICD-10-CM

## 2013-11-29 DIAGNOSIS — R059 Cough, unspecified: Secondary | ICD-10-CM

## 2013-11-29 DIAGNOSIS — Z23 Encounter for immunization: Secondary | ICD-10-CM

## 2013-11-29 DIAGNOSIS — R454 Irritability and anger: Secondary | ICD-10-CM

## 2013-11-29 MED ORDER — CITALOPRAM HYDROBROMIDE 20 MG PO TABS: 20.0000 mg | ORAL_TABLET | Freq: Every day | ORAL | Status: DC

## 2013-11-29 MED ORDER — RANITIDINE HCL 150 MG PO TABS
150.0000 mg | ORAL_TABLET | Freq: Two times a day (BID) | ORAL | Status: DC
Start: 2013-11-29 — End: 2015-01-03

## 2013-11-29 NOTE — Progress Notes (Signed)
Pre visit review using our clinic review tool, if applicable. No additional management support is needed unless otherwise documented below in the visit note.  Routine anticipatory guidance given to patient.  See health maintenance. Flu encouraged.  Done at work, fall 2014 Shingles due at 25.   PNA due at 63 Tetanus today.   Colon 2017 Breast cancer screening- mammogram 2014 Breast exam done at GYN clinic recently.  DXA not due.   Pelvic exam at gyn clinic recently.   Advance directive.  Encouraged.  Husband designated if incapacitated.    Hypothyroidism.  Per Dr. Buddy Duty.    Lipids checked by Dr. Buddy Duty.    Cough. Episodic, ongoing for months. Known HH, occ regurgitation. Sleeping flat at night.  No fevers, chills. No weight loss.   Irritability.  She came off the citalopram for a few weeks.  She was irritable off the medicine.  Back on the medicine w/o ADE, mood improved.  Wants to continue.  No SI/HI.    BP elevated today, was normal at gyn clinic recently (122/76).   Diet and salt discussed.  Needs to work on diet and exercise.  She is going to get back on her treadmill.    PMH and SH reviewed  Meds, vitals, and allergies reviewed.   ROS: See HPI.  Otherwise negative.    GEN: nad, alert and oriented HEENT: mucous membranes moist NECK: supple w/o LA CV: rrr. PULM: ctab, no inc wob ABD: soft, +bs EXT: no edema SKIN: no acute rash

## 2013-11-29 NOTE — Patient Instructions (Signed)
Elevate the head of your bed.  Try taking zantac 150mg  1-2 times a day.  Work on getting more exercise on the treadmill.   Take care.

## 2013-11-30 DIAGNOSIS — Z0000 Encounter for general adult medical examination without abnormal findings: Secondary | ICD-10-CM | POA: Insufficient documentation

## 2013-11-30 DIAGNOSIS — R454 Irritability and anger: Secondary | ICD-10-CM | POA: Insufficient documentation

## 2013-11-30 DIAGNOSIS — R05 Cough: Secondary | ICD-10-CM | POA: Insufficient documentation

## 2013-11-30 DIAGNOSIS — R059 Cough, unspecified: Secondary | ICD-10-CM | POA: Insufficient documentation

## 2013-11-30 NOTE — Assessment & Plan Note (Signed)
Continue SSRI. Okay for outpatient f/u.

## 2013-11-30 NOTE — Assessment & Plan Note (Signed)
Will request labs from endo.

## 2013-11-30 NOTE — Assessment & Plan Note (Signed)
Routine anticipatory guidance given to patient.  See health maintenance. Flu encouraged.  Done at work, fall 2014 Shingles due at 6.   PNA due at 63 Tetanus today.   Colon 2017 Breast cancer screening- mammogram 2014 Breast exam done at GYN clinic recently.  DXA not due.   Pelvic exam at gyn clinic recently.   Advance directive.  Encouraged.  Husband designated if incapacitated.

## 2013-11-30 NOTE — Assessment & Plan Note (Signed)
Likely reflux related, d/w pt about weight loss and elevating head of bed.  Use zantac prn and f/u prn.  She agrees.

## 2013-12-20 ENCOUNTER — Ambulatory Visit (INDEPENDENT_AMBULATORY_CARE_PROVIDER_SITE_OTHER): Payer: Managed Care, Other (non HMO) | Admitting: Family Medicine

## 2013-12-20 ENCOUNTER — Encounter: Payer: Self-pay | Admitting: Family Medicine

## 2013-12-20 VITALS — BP 144/76 | HR 90 | Temp 98.6°F | Wt 233.0 lb

## 2013-12-20 DIAGNOSIS — R059 Cough, unspecified: Secondary | ICD-10-CM

## 2013-12-20 DIAGNOSIS — R05 Cough: Secondary | ICD-10-CM

## 2013-12-20 MED ORDER — BENZONATATE 200 MG PO CAPS: 200.0000 mg | ORAL_CAPSULE | Freq: Three times a day (TID) | ORAL | Status: DC | PRN

## 2013-12-20 MED ORDER — DOXYCYCLINE HYCLATE 100 MG PO TABS: 100.0000 mg | ORAL_TABLET | Freq: Two times a day (BID) | ORAL | Status: DC

## 2013-12-20 NOTE — Assessment & Plan Note (Signed)
Likely bronchitis, use tesslon and start doxy.  D/w pt.  Nontoxic, okay for outpatient f/u.

## 2013-12-20 NOTE — Patient Instructions (Signed)
Start the doxycycline today and use tessalon for cough.  Try to get some rest.  Call back as needed.  Take care.

## 2013-12-20 NOTE — Progress Notes (Signed)
Pre visit review using our clinic review tool, if applicable. No additional management support is needed unless otherwise documented below in the visit note.  Cough for ~2 weeks.  With sputum recently.  Taking mult OTC meds for last few days. Started with URI sx, then "moved on down" in her chest.  No fevers.  Some chills.  Vomiting form coughing and gagging.  No ear pain.  Some rhinorrhea initially, less now.  No ST, scratchy from coughing.  No rash.  The cough is the main issue for her recently.  More sinus/nasal stuffiness recently.   Meds, vitals, and allergies reviewed.   ROS: See HPI.  Otherwise, noncontributory.  GEN: nad, alert and oriented HEENT: mucous membranes moist, tm w/o erythema, nasal exam w/o erythema, clear discharge noted,  OP with cobblestoning NECK: supple w/o LA CV: rrr.   PULM: ctab except for scattered rhonchi, no wheeze, no inc wob EXT: no edema SKIN: no acute rash

## 2014-01-17 ENCOUNTER — Telehealth: Payer: Self-pay | Admitting: Family Medicine

## 2014-01-17 NOTE — Telephone Encounter (Signed)
Patient Information:  Caller Name: Jeannene Patella  Phone: [phone]  Patient: Sharon Benjamin, Sharon Benjamin  Gender: Female  DOB: Dec 26, 1955  Age: 58 Years  PCP: Elsie Stain Brigitte Pulse) [facility])  Office Follow Up:  Does the office need to follow up with this patient?: Yes  Instructions For The Office: Please review  RN Note:  Pt is requesting abx for the treatment of cough.  SHe states that this cough feels like the cough she had 4 weeks ago that Doxycycline was given for.  Symptoms  Reason For Call & Symptoms: [place]  She states that she has fever 99-100.  Pt states that she had a stomach virus on 4/19 then started with chest congestion 4/22 with inceased drainage.  Coughing.  Pt states that she had Bronchitis about 4 weeks ago.  She does have allergy sxs.  Cough is dry.  Fever present 4/23  Reviewed Health History In EMR: Yes  Reviewed Medications In EMR: Yes  Reviewed Allergies In EMR: Yes  Reviewed Surgeries / Procedures: Yes  Date of Onset of Symptoms: 01/14/2014  Guideline(s) Used:  Cough  Disposition Per Guideline:   Home Care  Reason For Disposition Reached:   Cough with no complications  Advice Given:  Reassurance  Coughing is the way that our lungs remove irritants and mucus. It helps protect our lungs from getting pneumonia.  Coughing Spasms:  Drink warm fluids. Inhale warm mist (Reason: both relax the airway and loosen up the phlegm).  Prevent Dehydration:  Drink adequate liquids.  Prevent Dehydration:  Drink adequate liquids.  This will help soothe an irritated or dry throat and loosen up the phlegm.  Patient Will Follow Care Advice:  YES

## 2014-01-17 NOTE — Telephone Encounter (Signed)
Patient notified and verbalized understanding. 

## 2014-01-17 NOTE — Telephone Encounter (Signed)
If she's sick again, then she needs to be checked before starting abx.

## 2014-04-30 ENCOUNTER — Other Ambulatory Visit: Payer: Self-pay

## 2014-04-30 DIAGNOSIS — Z1231 Encounter for screening mammogram for malignant neoplasm of breast: Secondary | ICD-10-CM

## 2014-05-02 ENCOUNTER — Ambulatory Visit (INDEPENDENT_AMBULATORY_CARE_PROVIDER_SITE_OTHER): Payer: Managed Care, Other (non HMO) | Admitting: Family Medicine

## 2014-05-02 ENCOUNTER — Encounter: Payer: Self-pay | Admitting: Family Medicine

## 2014-05-02 VITALS — BP 148/82 | HR 86 | Temp 98.9°F | Wt 235.5 lb

## 2014-05-02 DIAGNOSIS — R05 Cough: Secondary | ICD-10-CM

## 2014-05-02 DIAGNOSIS — R059 Cough, unspecified: Secondary | ICD-10-CM

## 2014-05-02 DIAGNOSIS — J069 Acute upper respiratory infection, unspecified: Secondary | ICD-10-CM

## 2014-05-02 MED ORDER — AZITHROMYCIN 250 MG PO TABS
ORAL_TABLET | ORAL | Status: DC
Start: 2014-05-02 — End: 2014-07-23

## 2014-05-02 MED ORDER — BENZONATATE 200 MG PO CAPS: 200.0000 mg | ORAL_CAPSULE | Freq: Three times a day (TID) | ORAL | Status: DC | PRN

## 2014-05-02 NOTE — Progress Notes (Signed)
Pre visit review using our clinic review tool, if applicable. No additional management support is needed unless otherwise documented below in the visit note.  Scratchy throat for about 6 days.  ST persists.  Some cough, usually in AM, some sputum. Some cough at night.  No fevers.  No vomiting, no diarrhea. Some ear pain. No rhinorrhea, not stuffy.  Voice is altered.  She isn't getting better, but isn't worse recently.  Has been gargling with salt water in meantime.  Transient help.    Meds, vitals, and allergies reviewed.   ROS: See HPI.  Otherwise, noncontributory.  GEN: nad, alert and oriented HEENT: mucous membranes moist, tm w/o erythema, nasal exam w/o erythema, clear discharge noted,  OP with cobblestoning, sinuses not ttp NECK: supple w/o LA CV: rrr.   PULM: ctab, no inc wob, cough noted.  EXT: no edema SKIN: no acute rash

## 2014-05-02 NOTE — Patient Instructions (Signed)
Use tessalon for cough. If not better in a few days, then start the zithromax.  If you take it, skip the citalopram.  Take care.  Try to get some rest.

## 2014-05-03 DIAGNOSIS — J069 Acute upper respiratory infection, unspecified: Secondary | ICD-10-CM | POA: Insufficient documentation

## 2014-05-03 NOTE — Assessment & Plan Note (Signed)
Nontoxic, tessalon for cough, hold abx for now.  Routine cautions given. See instructions. F/u prn. Supportive care in the meantime.

## 2014-05-10 ENCOUNTER — Ambulatory Visit
Admission: RE | Admit: 2014-05-10 | Discharge: 2014-05-10 | Disposition: A | Discharge status: Home / Self Care | Payer: Managed Care, Other (non HMO) | Source: Ambulatory Visit

## 2014-05-10 DIAGNOSIS — Z1231 Encounter for screening mammogram for malignant neoplasm of breast: Secondary | ICD-10-CM

## 2014-05-14 ENCOUNTER — Encounter: Payer: Self-pay | Admitting: *Deleted

## 2014-05-28 ENCOUNTER — Other Ambulatory Visit: Payer: Self-pay | Admitting: Family Medicine

## 2014-07-23 ENCOUNTER — Ambulatory Visit (INDEPENDENT_AMBULATORY_CARE_PROVIDER_SITE_OTHER): Payer: Commercial Indemnity

## 2014-07-23 ENCOUNTER — Ambulatory Visit (INDEPENDENT_AMBULATORY_CARE_PROVIDER_SITE_OTHER): Payer: Commercial Indemnity | Admitting: Podiatry

## 2014-07-23 ENCOUNTER — Encounter: Payer: Self-pay | Admitting: Podiatry

## 2014-07-23 VITALS — BP 143/73 | HR 77 | Resp 16 | Ht 65.0 in | Wt 234.0 lb

## 2014-07-23 DIAGNOSIS — M722 Plantar fascial fibromatosis: Secondary | ICD-10-CM

## 2014-07-23 MED ORDER — METHYLPREDNISOLONE (PAK) 4 MG PO TABS: ORAL_TABLET | ORAL | Status: DC

## 2014-07-23 MED ORDER — MELOXICAM 15 MG PO TABS: 15.0000 mg | ORAL_TABLET | Freq: Every day | ORAL | Status: DC

## 2014-07-23 NOTE — Progress Notes (Signed)
   Subjective:    Patient ID: Sharon Benjamin, female    DOB: 12/08/1955, 58 y.o.   MRN: [id number]  HPI Comments: "I have that plantar fasciitis"  Patient c/o aching plantar heel right for about 3-4 months. She has AM pain. She says that she had this 8 years ago. She has been stretching and Ibuprofen-no help.  Foot Pain Associated symptoms include arthralgias.      Review of Systems  Eyes: Positive for itching.  Musculoskeletal: Positive for arthralgias and back pain.  All other systems reviewed and are negative.      Objective:   Physical Exam: I have reviewed her past mental history medications allergy surgery social history and review of systems. Pulses are strongly palpable bilateral. Neurologic sensorium is intact percent once the monofilament. Deep tendon reflexes are intact bilateral muscle strength is 5 over 5 dorsiflexion plantar flexors and inverters everters all intrinsic musculature is intact. Orthopedic evaluation and x-rays all joints distal to the ankle range of motion without crepitation. She has severe pain on palpation of the medial continued tubercle the right heel. Radiographic evaluation does demonstrate only a soft tissue increased density at the plantar fascial cannula insertion site of her rectus foot. This is consistent with plantar fasciitis.        Assessment & Plan:  Assessment: Plantar fasciitis right foot.  Plan: Discussed etiology pathology conservative versus surgical therapies. She has been started on a Medrol Dosepak to be followed by meloxicam. Injected the right heel. With Kenalog and local anesthetic. Placed her in a plantar fascial brace and was dispensed a night splint. Discussed appropriate shoe gear stretching exercises ice therapy and shoe gear modifications. I will follow up with her in 1 month.

## 2014-07-23 NOTE — Patient Instructions (Signed)

## 2014-08-20 ENCOUNTER — Ambulatory Visit (INDEPENDENT_AMBULATORY_CARE_PROVIDER_SITE_OTHER): Payer: Commercial Indemnity | Admitting: Podiatry

## 2014-08-20 ENCOUNTER — Encounter: Payer: Self-pay | Admitting: Podiatry

## 2014-08-20 VITALS — BP 146/63 | HR 80 | Resp 16

## 2014-08-20 DIAGNOSIS — M722 Plantar fascial fibromatosis: Secondary | ICD-10-CM

## 2014-08-20 NOTE — Progress Notes (Signed)
She presents today for follow-up of her plantar fasciitis stating that is approximately 90% improved.  Objective: Vital signs are stable she is alert and oriented 3. Pulses are palpable right foot. Mild tenderness on palpation medial calcaneal tubercle of the right heel. They feels much better than it previously did.  Assessment: Plantar fasciitis right.  Plan: Reinjected her right heel today. She will continue all conservative therapies until completely well follow up with me in 1 month if necessary.

## 2014-09-17 ENCOUNTER — Ambulatory Visit: Payer: Commercial Indemnity | Admitting: Podiatry

## 2014-12-02 ENCOUNTER — Other Ambulatory Visit: Payer: Self-pay | Admitting: Family Medicine

## 2014-12-23 ENCOUNTER — Other Ambulatory Visit: Payer: Self-pay | Admitting: Family Medicine

## 2014-12-23 DIAGNOSIS — E039 Hypothyroidism, unspecified: Secondary | ICD-10-CM

## 2014-12-23 DIAGNOSIS — E785 Hyperlipidemia, unspecified: Secondary | ICD-10-CM

## 2014-12-25 ENCOUNTER — Other Ambulatory Visit (INDEPENDENT_AMBULATORY_CARE_PROVIDER_SITE_OTHER): Payer: Managed Care, Other (non HMO)

## 2014-12-25 DIAGNOSIS — E785 Hyperlipidemia, unspecified: Secondary | ICD-10-CM

## 2014-12-25 DIAGNOSIS — E039 Hypothyroidism, unspecified: Secondary | ICD-10-CM

## 2014-12-25 LAB — COMPREHENSIVE METABOLIC PANEL
ALT: 21 U/L (ref 0–35)
AST: 20 U/L (ref 0–37)
Albumin: 4 g/dL (ref 3.5–5.2)
Alkaline Phosphatase: 101 U/L (ref 39–117)
BUN: 18 mg/dL (ref 6–23)
CHLORIDE: 104 meq/L (ref 96–112)
CO2: 31 meq/L (ref 19–32)
Calcium: 9.4 mg/dL (ref 8.4–10.5)
Creatinine, Ser: 0.7 mg/dL (ref 0.40–1.20)
GFR: 90.63 mL/min (ref >60.00)
GLUCOSE: 121 mg/dL — AB (ref 70–99)
Potassium: 4.4 mEq/L (ref 3.5–5.1)
Sodium: 140 mEq/L (ref 135–145)
Total Bilirubin: 0.5 mg/dL (ref 0.2–1.2)
Total Protein: 7 g/dL (ref 6.0–8.3)

## 2014-12-25 LAB — LIPID PANEL
CHOL/HDL RATIO: 4
CHOLESTEROL: 173 mg/dL (ref 0–200)
HDL: 39.3 mg/dL (ref >39.00)
LDL Cholesterol: 105 mg/dL — ABNORMAL HIGH (ref 0–99)
NONHDL: 133.7
Triglycerides: 144 mg/dL (ref 0.0–149.0)
VLDL: 28.8 mg/dL (ref 0.0–40.0)

## 2014-12-25 LAB — TSH: TSH: 2.16 u[IU]/mL (ref 0.35–4.50)

## 2014-12-26 ENCOUNTER — Other Ambulatory Visit: Payer: Self-pay | Admitting: Family Medicine

## 2015-01-03 ENCOUNTER — Encounter: Payer: Self-pay | Admitting: Family Medicine

## 2015-01-03 ENCOUNTER — Ambulatory Visit (INDEPENDENT_AMBULATORY_CARE_PROVIDER_SITE_OTHER): Payer: Managed Care, Other (non HMO) | Admitting: Family Medicine

## 2015-01-03 VITALS — BP 142/84 | HR 77 | Temp 98.6°F | Resp 16 | Ht 65.0 in | Wt 239.1 lb

## 2015-01-03 DIAGNOSIS — Z0000 Encounter for general adult medical examination without abnormal findings: Secondary | ICD-10-CM | POA: Diagnosis not present

## 2015-01-03 DIAGNOSIS — E042 Nontoxic multinodular goiter: Secondary | ICD-10-CM

## 2015-01-03 DIAGNOSIS — E785 Hyperlipidemia, unspecified: Secondary | ICD-10-CM

## 2015-01-03 DIAGNOSIS — R739 Hyperglycemia, unspecified: Secondary | ICD-10-CM

## 2015-01-03 DIAGNOSIS — Z7189 Other specified counseling: Secondary | ICD-10-CM

## 2015-01-03 DIAGNOSIS — R454 Irritability and anger: Secondary | ICD-10-CM

## 2015-01-03 MED ORDER — CITALOPRAM HYDROBROMIDE 20 MG PO TABS: 20.0000 mg | ORAL_TABLET | Freq: Every day | ORAL | Status: DC

## 2015-01-03 MED ORDER — LEVOTHYROXINE SODIUM 75 MCG PO TABS: 75.0000 ug | ORAL_TABLET | Freq: Every day | ORAL | Status: DC

## 2015-01-03 MED ORDER — SIMVASTATIN 20 MG PO TABS: 20.0000 mg | ORAL_TABLET | Freq: Every day | ORAL | Status: DC

## 2015-01-03 NOTE — Patient Instructions (Addendum)
Check with your insurance to see if they will cover the shingles shot. Recheck sugar in about 6 months.  Lab visit.   Try an OTC ankle lace up brace.  That should help.  If not, then let me know.

## 2015-01-03 NOTE — Progress Notes (Signed)
CPE- See plan.  Routine anticipatory guidance given to patient.  See health maintenance. Flu encouraged. Done at work prev Shingles d/w pt.   PNA due at 63 Tetanus 2015 Colon 2017 Breast cancer screening- mammogram 2015 DXA not due. Pelvic exam per gyn clinic- she'll f/u about that. Advance directive. Encouraged. Husband designated if incapacitated.   Elevated Cholesterol: Using medications without problems:yes Muscle aches: no Diet compliance: "bad".  She has plans to work on diet.   Exercise: limited by plantar fasciitis.  Has been injected by podiatry.  Sleeping in a boot at night, stretching. She is considering a bike for exercise.    Sugar up, d/w pt.  Given copy of eat right diet and discussed.    R ankle pain.  Had stepped in a hole a few weeks ago.  No bruising then or now.  Some lateral inferior pain, inferior to the lateral mal.  Has been able to bear weight.   Hypothyroid.  TSH wnl.  No neck mass.    PMH and SH reviewed  Meds, vitals, and allergies reviewed.   ROS: See HPI.  Otherwise negative.    GEN: nad, alert and oriented HEENT: mucous membranes moist NECK: supple w/o LA CV: rrr. PULM: ctab, no inc wob ABD: soft, +bs EXT: no edema SKIN: no acute rash R foot with normal inspection.  Not ttp at the med/lat mal, not ttp at the 5th MT.  Slightly tender on R lateral inferior ankle, inferior to the lateral mal, but this is minimal.  Able to bear weight.  Midfoot not ttp o/w mortise intact and distally nv intact

## 2015-01-05 DIAGNOSIS — R739 Hyperglycemia, unspecified: Secondary | ICD-10-CM | POA: Insufficient documentation

## 2015-01-05 DIAGNOSIS — Z7189 Other specified counseling: Secondary | ICD-10-CM | POA: Insufficient documentation

## 2015-01-05 NOTE — Assessment & Plan Note (Signed)
Reasonable control, continue statin.  Needs work on diet and weight.  D/w pt.

## 2015-01-05 NOTE — Assessment & Plan Note (Addendum)
Routine anticipatory guidance given to patient. See health maintenance.  Flu encouraged. Done at work prev  Shingles d/w pt.  PNA due at 63  Tetanus 2015  Colon 2017  Breast cancer screening- mammogram 2015  DXA not due.  Pelvic exam per gyn clinic- she'll f/u about that.  Advance directive. Encouraged. Husband designated if patient were incapacitated.

## 2015-01-05 NOTE — Assessment & Plan Note (Signed)
Controlled with SSRI, no ADE, continue as is.  She agrees.

## 2015-01-05 NOTE — Assessment & Plan Note (Signed)
tsh wnl, continue as is.  D/w pt.  She agrees.

## 2015-01-05 NOTE — Assessment & Plan Note (Signed)
This is elevated, noted, d/w pt.   Needs work on diet and weight.  D/w pt.   Recheck A1c later in 2016.  Use eat right diet, d/w pt.  She agrees.

## 2015-04-22 ENCOUNTER — Other Ambulatory Visit: Payer: Self-pay

## 2015-04-22 DIAGNOSIS — Z1231 Encounter for screening mammogram for malignant neoplasm of breast: Secondary | ICD-10-CM

## 2015-06-06 ENCOUNTER — Ambulatory Visit
Admission: RE | Admit: 2015-06-06 | Discharge: 2015-06-06 | Disposition: A | Discharge status: Home / Self Care | Payer: Managed Care, Other (non HMO) | Source: Ambulatory Visit

## 2015-06-06 DIAGNOSIS — Z1231 Encounter for screening mammogram for malignant neoplasm of breast: Secondary | ICD-10-CM

## 2015-10-20 ENCOUNTER — Encounter: Payer: Self-pay | Admitting: Gastroenterology

## 2015-12-01 ENCOUNTER — Encounter: Payer: Self-pay | Admitting: Gastroenterology

## 2016-01-05 ENCOUNTER — Other Ambulatory Visit: Payer: Self-pay | Admitting: Family Medicine

## 2016-01-13 ENCOUNTER — Ambulatory Visit (AMBULATORY_SURGERY_CENTER): Payer: Self-pay

## 2016-01-13 VITALS — Ht 65.5 in | Wt 240.4 lb

## 2016-01-13 DIAGNOSIS — Z1211 Encounter for screening for malignant neoplasm of colon: Secondary | ICD-10-CM

## 2016-01-13 MED ORDER — SUPREP BOWEL PREP KIT 17.5-3.13-1.6 GM/177ML PO SOLN: 1.0000 | Freq: Once | ORAL | Status: DC

## 2016-01-13 NOTE — Progress Notes (Signed)
No allergies to eggs or soy No past problems with anesthesia No home oxygen No diet meds  Registered for emmi  

## 2016-01-15 NOTE — Progress Notes (Signed)
Can not close encounter. Ticket placed.

## 2016-02-03 ENCOUNTER — Ambulatory Visit (AMBULATORY_SURGERY_CENTER): Payer: Managed Care, Other (non HMO) | Admitting: Gastroenterology

## 2016-02-03 ENCOUNTER — Encounter: Payer: Self-pay | Admitting: Gastroenterology

## 2016-02-03 VITALS — BP 132/60 | HR 68 | Temp 97.8°F | Resp 13 | Ht 65.5 in | Wt 240.0 lb

## 2016-02-03 DIAGNOSIS — Z1211 Encounter for screening for malignant neoplasm of colon: Secondary | ICD-10-CM | POA: Diagnosis not present

## 2016-02-03 MED ORDER — SODIUM CHLORIDE 0.9 % IV SOLN: 500.0000 mL | INTRAVENOUS | Status: DC

## 2016-02-03 NOTE — Op Note (Signed)
[facility] Patient Name: Sharon Benjamin Procedure Date: 02/03/2016 9:09 AM MRN: [id number] Endoscopist: Ladene Artist , MD Age: 60 Date of Birth: 1956/01/31 Gender: Female Procedure:                Colonoscopy Indications:              Screening for colorectal malignant neoplasm Medicines:                Monitored Anesthesia Care Procedure:                Pre-Anesthesia Assessment:                           - Prior to the procedure, a History and Physical                            was performed, and patient medications and                            allergies were reviewed. The patient's tolerance of                            previous anesthesia was also reviewed. The risks                            and benefits of the procedure and the sedation                            options and risks were discussed with the patient.                            All questions were answered, and informed consent                            was obtained. Prior Anticoagulants: The patient has                            taken no previous anticoagulant or antiplatelet                            agents. ASA Grade Assessment: II - A patient with                            mild systemic disease. After reviewing the risks                            and benefits, the patient was deemed in                            satisfactory condition to undergo the procedure.                           After obtaining informed consent, the colonoscope  was passed under direct vision. Throughout the                            procedure, the patient's blood pressure, pulse, and                            oxygen saturations were monitored continuously. The                            Model PCF-H190L [id number]) scope was introduced                            through the anus and advanced to the the cecum,                            identified by appendiceal orifice and ileocecal                       valve. The colonoscopy was performed without                            difficulty. The patient tolerated the procedure                            well. The quality of the bowel preparation was                            good. The ileocecal valve, appendiceal orifice, and                            rectum were photographed. Scope In: 9:16:51 AM Scope Out: 9:29:45 AM Scope Withdrawal Time: 0 hours 9 minutes 48 seconds  Total Procedure Duration: 0 hours 12 minutes 54 seconds  Findings:                 The digital rectal exam was normal.                           Multiple small-mouthed diverticula were found in                            the sigmoid colon and descending colon. There was                            no evidence of diverticular bleeding. Colon spasm                            and luminal narrowing were noted.                           Multiple small-mouthed diverticula were found in                            the transverse colon. There was no evidence of  diverticular bleeding.                           The exam was otherwise normal throughout the                            examined colon.                           The retroflexed view of the distal rectum and anal                            verge was normal and showed no anal or rectal                            abnormalities. Complications:            No immediate complications. Estimated Blood Loss:     Estimated blood loss: none. Impression:               - Moderate diverticulosis in the sigmoid colon and                            in the descending colon.                           - Mild diverticulosis in the transverse colon.                           - Recommendation:           - Patient has a contact number available for                            emergencies. The signs and symptoms of potential                            delayed complications were discussed with the                             patient. Return to normal activities tomorrow.                            Written discharge instructions were provided to the                            patient.                           - High fiber diet indefinitely.                           - Continue present medications.                           - Repeat colonoscopy in 10 years for screening  purposes. Ladene Artist, MD 02/03/2016 9:39:57 AM This report has been signed electronically.

## 2016-02-03 NOTE — Progress Notes (Signed)
No egg or soy allergy known to patient  No issues with past sedation with any surgeries  or procedures, no intubation problems  No diet pills per patient No home 02 use per patient  No blood thinners per patient  Pt denies issues with constipation   

## 2016-02-03 NOTE — Patient Instructions (Signed)
YOU HAD AN ENDOSCOPIC PROCEDURE TODAY AT THE [facility]:   Refer to the procedure report that was given to you for any specific questions about what was found during the examination.  If the procedure report does not answer your questions, please call your gastroenterologist to clarify.  If you requested that your care partner not be given the details of your procedure findings, then the procedure report has been included in a sealed envelope for you to review at your convenience later.  YOU SHOULD EXPECT: Some feelings of bloating in the abdomen. Passage of more gas than usual.  Walking can help get rid of the air that was put into your GI tract during the procedure and reduce the bloating. If you had a lower endoscopy (such as a colonoscopy or flexible sigmoidoscopy) you may notice spotting of blood in your stool or on the toilet paper. If you underwent a bowel prep for your procedure, you may not have a normal bowel movement for a few days.  Please Note:  You might notice some irritation and congestion in your nose or some drainage.  This is from the oxygen used during your procedure.  There is no need for concern and it should clear up in a day or so.  SYMPTOMS TO REPORT IMMEDIATELY:   Following lower endoscopy (colonoscopy or flexible sigmoidoscopy):  Excessive amounts of blood in the stool  Significant tenderness or worsening of abdominal pains  Swelling of the abdomen that is new, acute  Fever of 100F or higher   For urgent or emergent issues, a gastroenterologist can be reached at any hour by calling [phone].   DIET: Your first meal following the procedure should be a small meal and then it is ok to progress to your normal diet. Heavy or fried foods are harder to digest and may make you feel nauseous or bloated.  Likewise, meals heavy in dairy and vegetables can increase bloating.  Drink plenty of fluids but you should avoid alcoholic beverages for 24 hours.  Try to  increase the fiber in your diet.  ACTIVITY:  You should plan to take it easy for the rest of today and you should NOT DRIVE or use heavy machinery until tomorrow (because of the sedation medicines used during the test).    FOLLOW UP: Our staff will call the number listed on your records the next business day following your procedure to check on you and address any questions or concerns that you may have regarding the information given to you following your procedure. If we do not reach you, we will leave a message.  However, if you are feeling well and you are not experiencing any problems, there is no need to return our call.  We will assume that you have returned to your regular daily activities without incident.  If any biopsies were taken you will be contacted by phone or by letter within the next 1-3 weeks.  Please call us at [phone] if you have not heard about the biopsies in 3 weeks.    SIGNATURES/CONFIDENTIALITY: You and/or your care partner have signed paperwork which will be entered into your electronic medical record.  These signatures attest to the fact that that the information above on your After Visit Summary has been reviewed and is understood.  Full responsibility of the confidentiality of this discharge information lies with you and/or your care-partner.  Read all of the handouts given to you by your recovery room nurse.  Thank-you   for choosing us for your healthcare needs today. 

## 2016-02-03 NOTE — Progress Notes (Signed)
To Pacu  Awake and alert Report to RN 

## 2016-02-04 ENCOUNTER — Telehealth: Payer: Self-pay | Admitting: *Deleted

## 2016-02-04 NOTE — Telephone Encounter (Signed)
  Follow up Call-  Call back number 02/03/2016  Post procedure Call Back phone  # [phone]  Permission to leave phone message Yes     Patient questions:  Do you have a fever, pain , or abdominal swelling? No. Pain Score  0 *  Have you tolerated food without any problems? Yes.    Have you been able to return to your normal activities? Yes.    Do you have any questions about your discharge instructions: Diet   No. Medications  No. Follow up visit  No.  Do you have questions or concerns about your Care? No.  Actions: * If pain score is 4 or above: No action needed, pain <4.  Pt. In shower,spoke with spouse.

## 2016-02-20 ENCOUNTER — Other Ambulatory Visit: Payer: Self-pay | Admitting: Family Medicine

## 2016-03-23 ENCOUNTER — Other Ambulatory Visit: Payer: Self-pay | Admitting: Family Medicine

## 2016-04-20 ENCOUNTER — Other Ambulatory Visit: Payer: Self-pay | Admitting: Family Medicine

## 2016-04-30 ENCOUNTER — Other Ambulatory Visit: Payer: Self-pay | Admitting: Family Medicine

## 2016-04-30 DIAGNOSIS — E785 Hyperlipidemia, unspecified: Secondary | ICD-10-CM

## 2016-04-30 DIAGNOSIS — E042 Nontoxic multinodular goiter: Secondary | ICD-10-CM

## 2016-05-03 ENCOUNTER — Other Ambulatory Visit (INDEPENDENT_AMBULATORY_CARE_PROVIDER_SITE_OTHER): Payer: Managed Care, Other (non HMO)

## 2016-05-03 DIAGNOSIS — E042 Nontoxic multinodular goiter: Secondary | ICD-10-CM

## 2016-05-03 DIAGNOSIS — E785 Hyperlipidemia, unspecified: Secondary | ICD-10-CM

## 2016-05-03 LAB — COMPREHENSIVE METABOLIC PANEL
ALBUMIN: 4.2 g/dL (ref 3.5–5.2)
ALK PHOS: 91 U/L (ref 39–117)
ALT: 20 U/L (ref 0–35)
AST: 18 U/L (ref 0–37)
BILIRUBIN TOTAL: 0.4 mg/dL (ref 0.2–1.2)
BUN: 13 mg/dL (ref 6–23)
CALCIUM: 9.3 mg/dL (ref 8.4–10.5)
CO2: 30 meq/L (ref 19–32)
CREATININE: 0.73 mg/dL (ref 0.40–1.20)
Chloride: 105 mEq/L (ref 96–112)
GFR: 85.95 mL/min (ref >60.00)
Glucose, Bld: 119 mg/dL — ABNORMAL HIGH (ref 70–99)
Potassium: 4 mEq/L (ref 3.5–5.1)
Sodium: 141 mEq/L (ref 135–145)
TOTAL PROTEIN: 7.1 g/dL (ref 6.0–8.3)

## 2016-05-03 LAB — LIPID PANEL
CHOL/HDL RATIO: 4
CHOLESTEROL: 180 mg/dL (ref 0–200)
HDL: 40.6 mg/dL (ref >39.00)
LDL Cholesterol: 116 mg/dL — ABNORMAL HIGH (ref 0–99)
NonHDL: 139.25
TRIGLYCERIDES: 116 mg/dL (ref 0.0–149.0)
VLDL: 23.2 mg/dL (ref 0.0–40.0)

## 2016-05-03 LAB — TSH: TSH: 5.22 u[IU]/mL — ABNORMAL HIGH (ref 0.35–4.50)

## 2016-05-07 ENCOUNTER — Encounter: Payer: Self-pay | Admitting: Family Medicine

## 2016-05-07 ENCOUNTER — Ambulatory Visit (INDEPENDENT_AMBULATORY_CARE_PROVIDER_SITE_OTHER): Payer: Managed Care, Other (non HMO) | Admitting: Family Medicine

## 2016-05-07 VITALS — BP 120/70 | HR 81 | Temp 99.0°F | Ht 65.0 in | Wt 232.0 lb

## 2016-05-07 DIAGNOSIS — E890 Postprocedural hypothyroidism: Secondary | ICD-10-CM

## 2016-05-07 DIAGNOSIS — Z0000 Encounter for general adult medical examination without abnormal findings: Secondary | ICD-10-CM

## 2016-05-07 DIAGNOSIS — R454 Irritability and anger: Secondary | ICD-10-CM

## 2016-05-07 DIAGNOSIS — E785 Hyperlipidemia, unspecified: Secondary | ICD-10-CM

## 2016-05-07 DIAGNOSIS — R12 Heartburn: Secondary | ICD-10-CM

## 2016-05-07 DIAGNOSIS — R739 Hyperglycemia, unspecified: Secondary | ICD-10-CM

## 2016-05-07 DIAGNOSIS — E042 Nontoxic multinodular goiter: Secondary | ICD-10-CM

## 2016-05-07 DIAGNOSIS — E039 Hypothyroidism, unspecified: Secondary | ICD-10-CM

## 2016-05-07 MED ORDER — SIMVASTATIN 20 MG PO TABS: 20.0000 mg | ORAL_TABLET | Freq: Every day | ORAL | 3 refills | Status: DC

## 2016-05-07 MED ORDER — CITALOPRAM HYDROBROMIDE 20 MG PO TABS: 20.0000 mg | ORAL_TABLET | Freq: Every day | ORAL | 3 refills | Status: DC

## 2016-05-07 MED ORDER — SYNTHROID 75 MCG PO TABS: ORAL_TABLET | ORAL | 3 refills | Status: DC

## 2016-05-07 NOTE — Patient Instructions (Addendum)
Check with your insurance to see if they will cover the shingles shot. Call about your mammogram appointment.   Recheck fasting labs in about 6 months.  We'll go from there.  Take care.  Glad to see you.  Use the eat right diet.

## 2016-05-07 NOTE — Progress Notes (Signed)
CPE- See plan.  Routine anticipatory guidance given to patient.  See health maintenance. Tetanus 2015  Flu encouraged. Done at work prev  Shingles d/w pt.  PNA due at 63  Colon 2017  Breast cancer screening- mammogram 2016 DXA not due.  Pelvic exam per gyn clinic- she'll f/u about that.  Advance directive. Encouraged. Husband designated if patient were incapacitated.  [facility].  Diet and exercise d/w pt.  "Not good."  D/w pt.  Encouraged.     Elevated Cholesterol: Using medications without problems:yes Muscle aches: no Diet compliance: encouraged Exercise: encouraged Labs d/w pt.    Hypothyroidism.  Labs d/w pt.  No neck mass, no dysphagia, compliant with med.   Mood is good.  Work is busy.  She had to help her husband a lot after his surgery.  She is going to try to make some time for herself.    PPI not used daily.  Taken only prn, a few days at a time, then not for few weeks.    PMH and SH reviewed  Meds, vitals, and allergies reviewed.   ROS: Per HPI.  Unless specifically indicated otherwise in HPI, the patient denies:  General: fever. Eyes: acute vision changes ENT: sore throat Cardiovascular: chest pain Respiratory: SOB GI: vomiting GU: dysuria Musculoskeletal: acute back pain Derm: acute rash Neuro: acute motor dysfunction Psych: worsening mood Endocrine: polydipsia Heme: bleeding Allergy: hayfever  GEN: nad, alert and oriented HEENT: mucous membranes moist NECK: supple w/o LA, no tmg CV: rrr. PULM: ctab, no inc wob ABD: soft, +bs EXT: no edema SKIN: no acute rash

## 2016-05-09 DIAGNOSIS — E890 Postprocedural hypothyroidism: Secondary | ICD-10-CM | POA: Insufficient documentation

## 2016-05-09 DIAGNOSIS — R12 Heartburn: Secondary | ICD-10-CM | POA: Insufficient documentation

## 2016-05-09 NOTE — Assessment & Plan Note (Signed)
Labs d/w pt.  No neck mass, no dysphagia, compliant with med.  Continue as is.  Minimal TSH elevation, recheck in about 6 months.  She agrees.

## 2016-05-09 NOTE — Assessment & Plan Note (Signed)
Mood is good, continue med as is.

## 2016-05-09 NOTE — Assessment & Plan Note (Signed)
Recheck sugar in about 6 months.  Diet d/w pt, eat right diet d/w pt and given to patient.  She agrees.

## 2016-05-09 NOTE — Assessment & Plan Note (Signed)
Diet and weight d/w pt.  Continue statin, labs d/w pt.

## 2016-05-09 NOTE — Assessment & Plan Note (Signed)
Tetanus 2015  Flu encouraged. Done at work prev  Shingles d/w pt.  PNA due at 63  Colon 2017  Breast cancer screening- mammogram 2016 DXA not due.  Pelvic exam per gyn clinic- she'll f/u about that.  Advance directive. Encouraged. Husband designated if patient were incapacitated.  [facility].  Diet and exercise d/w pt.  "Not good."  D/w pt.  Encouraged.

## 2016-05-09 NOTE — Assessment & Plan Note (Signed)
PPI not used daily.  Taken only prn, a few days at a time, then not for few weeks.   Okay to continue prn dosing.  D/w pt about weight reduction and trigger avoidance.

## 2016-05-09 NOTE — Assessment & Plan Note (Signed)
S/p RAI

## 2016-10-08 LAB — HM MAMMOGRAPHY

## 2016-10-08 LAB — HM DEXA SCAN: HM DEXA SCAN: NORMAL

## 2017-07-07 ENCOUNTER — Other Ambulatory Visit: Payer: Self-pay | Admitting: Family Medicine

## 2017-07-17 ENCOUNTER — Other Ambulatory Visit: Payer: Self-pay | Admitting: Family Medicine

## 2017-07-17 DIAGNOSIS — E890 Postprocedural hypothyroidism: Secondary | ICD-10-CM

## 2017-07-17 DIAGNOSIS — E785 Hyperlipidemia, unspecified: Secondary | ICD-10-CM

## 2017-07-20 ENCOUNTER — Other Ambulatory Visit (INDEPENDENT_AMBULATORY_CARE_PROVIDER_SITE_OTHER): Payer: Managed Care, Other (non HMO)

## 2017-07-20 DIAGNOSIS — E890 Postprocedural hypothyroidism: Secondary | ICD-10-CM | POA: Diagnosis not present

## 2017-07-20 DIAGNOSIS — E785 Hyperlipidemia, unspecified: Secondary | ICD-10-CM | POA: Diagnosis not present

## 2017-07-20 LAB — COMPREHENSIVE METABOLIC PANEL
ALT: 20 U/L (ref 0–35)
AST: 20 U/L (ref 0–37)
Albumin: 4.3 g/dL (ref 3.5–5.2)
Alkaline Phosphatase: 90 U/L (ref 39–117)
BILIRUBIN TOTAL: 0.6 mg/dL (ref 0.2–1.2)
BUN: 15 mg/dL (ref 6–23)
CHLORIDE: 107 meq/L (ref 96–112)
CO2: 30 meq/L (ref 19–32)
CREATININE: 0.85 mg/dL (ref 0.40–1.20)
Calcium: 9.8 mg/dL (ref 8.4–10.5)
GFR: 71.82 mL/min (ref >60.00)
GLUCOSE: 110 mg/dL — AB (ref 70–99)
Potassium: 4.8 mEq/L (ref 3.5–5.1)
SODIUM: 144 meq/L (ref 135–145)
Total Protein: 7.2 g/dL (ref 6.0–8.3)

## 2017-07-20 LAB — LIPID PANEL
CHOL/HDL RATIO: 5
Cholesterol: 180 mg/dL (ref 0–200)
HDL: 39.5 mg/dL (ref >39.00)
LDL CALC: 116 mg/dL — AB (ref 0–99)
NONHDL: 140.08
TRIGLYCERIDES: 120 mg/dL (ref 0.0–149.0)
VLDL: 24 mg/dL (ref 0.0–40.0)

## 2017-07-20 LAB — TSH: TSH: 3.56 u[IU]/mL (ref 0.35–4.50)

## 2017-07-25 ENCOUNTER — Encounter: Payer: Self-pay | Admitting: Family Medicine

## 2017-07-25 ENCOUNTER — Ambulatory Visit (INDEPENDENT_AMBULATORY_CARE_PROVIDER_SITE_OTHER): Payer: Managed Care, Other (non HMO) | Admitting: Family Medicine

## 2017-07-25 VITALS — BP 130/76 | HR 72 | Temp 98.7°F | Ht 65.0 in | Wt 206.5 lb

## 2017-07-25 DIAGNOSIS — M25569 Pain in unspecified knee: Secondary | ICD-10-CM

## 2017-07-25 DIAGNOSIS — E669 Obesity, unspecified: Secondary | ICD-10-CM

## 2017-07-25 DIAGNOSIS — Z23 Encounter for immunization: Secondary | ICD-10-CM

## 2017-07-25 DIAGNOSIS — Z0000 Encounter for general adult medical examination without abnormal findings: Secondary | ICD-10-CM

## 2017-07-25 DIAGNOSIS — E785 Hyperlipidemia, unspecified: Secondary | ICD-10-CM

## 2017-07-25 DIAGNOSIS — E890 Postprocedural hypothyroidism: Secondary | ICD-10-CM

## 2017-07-25 DIAGNOSIS — R12 Heartburn: Secondary | ICD-10-CM

## 2017-07-25 DIAGNOSIS — Z7189 Other specified counseling: Secondary | ICD-10-CM

## 2017-07-25 DIAGNOSIS — R739 Hyperglycemia, unspecified: Secondary | ICD-10-CM

## 2017-07-25 DIAGNOSIS — R454 Irritability and anger: Secondary | ICD-10-CM

## 2017-07-25 MED ORDER — SYNTHROID 75 MCG PO TABS: ORAL_TABLET | ORAL | 3 refills | Status: DC

## 2017-07-25 NOTE — Progress Notes (Signed)
CPE- See plan.  Routine anticipatory guidance given to patient.  See health maintenance.  The possibility exists that previously documented standard health maintenance information may have been brought forward from a previous encounter into this note.  If needed, that same information has been updated to reflect the current situation based on today's encounter.    Tetanus 2015  Flu today Shingles d/w pt.  PNA due at 63  Colon 2017  Breast cancer screening- mammogram done 2018 per patient report.  DXA done per gyn.  Requesting records.    Pelvic exam per gyn clinic- she'll f/u about that.  Advance directive. Encouraged. Husband designated if patient were incapacitated.  [facility] and [facility].  Diet and exercise d/w pt.  "Doing pretty good."   She had taken phentermine in the past, off med now.  She is still working on diet. D/w pt about healthy habits.  She is working on portion control. She feels good about the change.    Still on citalopram and she wanted to continue, mood is okay.  She failed taper prev.  No ADE on med.  No SI/HI.    Hypothyroidism.  No neck mass, no dysphagia, no pain.  TSH wnl.  Compliant.   Elevated Cholesterol: Using medications without problems: yes Muscle aches: no Diet compliance: yes Exercise:yes Labs d/w pt.   GERD.  Better with weight loss, is rarely taking PPI now, d/w pt about full cessation.  She can change to zantac.    Knee OA with NSAID use prn with GI cautions given, d/w pt.   Knee pain improved with weight loss.    PMH and SH reviewed  Meds, vitals, and allergies reviewed.   ROS: Per HPI.  Unless specifically indicated otherwise in HPI, the patient denies:  General: fever. Eyes: acute vision changes ENT: sore throat Cardiovascular: chest pain Respiratory: SOB GI: vomiting GU: dysuria Musculoskeletal: acute back pain Derm: acute rash Neuro: acute motor dysfunction Psych: worsening mood Endocrine:  polydipsia Heme: bleeding Allergy: hayfever  GEN: nad, alert and oriented HEENT: mucous membranes moist NECK: supple w/o LA CV: rrr. PULM: ctab, no inc wob ABD: soft, +bs EXT: no edema SKIN: no acute rash

## 2017-07-25 NOTE — Patient Instructions (Addendum)
Check with your insurance to see if they will cover the shingrix shot. Take care.  Glad to see you.  Update me as needed.  Thanks for your effort.  Keep walking and working on your diet.

## 2017-07-27 DIAGNOSIS — M25569 Pain in unspecified knee: Secondary | ICD-10-CM | POA: Insufficient documentation

## 2017-07-27 NOTE — Assessment & Plan Note (Signed)
Labs discussed with patient. Continue work on diet and exercise. She agrees.

## 2017-07-27 NOTE — Assessment & Plan Note (Signed)
Better with weight loss, is rarely taking PPI now, d/w pt about full cessation.  She can change to zantac.  Update me as needed.

## 2017-07-27 NOTE — Assessment & Plan Note (Signed)
D/w pt about diet and exercise.

## 2017-07-27 NOTE — Assessment & Plan Note (Signed)
Advance directive. Encouraged. Husband designated if patient were incapacitated.

## 2017-07-27 NOTE — Assessment & Plan Note (Signed)
Still on citalopram and she wanted to continue, mood is okay.  She failed taper prev.  No ADE on med.  No SI/HI.

## 2017-07-27 NOTE — Assessment & Plan Note (Signed)
Labs discussed with patient. Continue statin. Continue work on diet and exercise. 

## 2017-07-27 NOTE — Assessment & Plan Note (Signed)
No neck mass, no dysphagia, no pain.  TSH wnl.  Compliant.

## 2017-07-27 NOTE — Assessment & Plan Note (Signed)
Tetanus 2015  Flu today Shingles d/w pt.  PNA due at 63  Colon 2017  Breast cancer screening- mammogram done 2018 per patient report.  DXA done per gyn.  Requesting records.    Pelvic exam per gyn clinic- she'll f/u about that.  Advance directive. Encouraged. Husband designated if patient were incapacitated.  [facility].  Diet and exercise d/w pt.  "Doing pretty good."   She had taken phentermine in the past, off med now.  She is still working on diet. D/w pt about healthy habits.  She is working on portion control. She feels good about the change.

## 2017-07-27 NOTE — Assessment & Plan Note (Signed)
Knee OA with NSAID use prn with GI cautions given, d/w pt.   Knee pain improved with weight loss.

## 2017-08-30 ENCOUNTER — Encounter: Payer: Self-pay | Admitting: Family Medicine

## 2018-07-13 ENCOUNTER — Other Ambulatory Visit: Payer: Self-pay | Admitting: Family Medicine

## 2018-07-24 LAB — HM PAP SMEAR: HPV, high-risk: NEGATIVE

## 2018-07-24 LAB — HM MAMMOGRAPHY

## 2018-07-30 ENCOUNTER — Other Ambulatory Visit: Payer: Self-pay | Admitting: Family Medicine

## 2018-07-30 DIAGNOSIS — E890 Postprocedural hypothyroidism: Secondary | ICD-10-CM

## 2018-07-30 DIAGNOSIS — E785 Hyperlipidemia, unspecified: Secondary | ICD-10-CM

## 2018-07-30 DIAGNOSIS — R739 Hyperglycemia, unspecified: Secondary | ICD-10-CM

## 2018-08-01 ENCOUNTER — Encounter: Payer: Managed Care, Other (non HMO) | Admitting: Family Medicine

## 2018-08-04 ENCOUNTER — Other Ambulatory Visit (INDEPENDENT_AMBULATORY_CARE_PROVIDER_SITE_OTHER): Payer: Managed Care, Other (non HMO)

## 2018-08-04 DIAGNOSIS — E890 Postprocedural hypothyroidism: Secondary | ICD-10-CM

## 2018-08-04 DIAGNOSIS — R739 Hyperglycemia, unspecified: Secondary | ICD-10-CM

## 2018-08-04 DIAGNOSIS — E785 Hyperlipidemia, unspecified: Secondary | ICD-10-CM | POA: Diagnosis not present

## 2018-08-04 LAB — LIPID PANEL
CHOL/HDL RATIO: 5
Cholesterol: 196 mg/dL (ref 0–200)
HDL: 41.3 mg/dL (ref >39.00)
NONHDL: 154.87
Triglycerides: 226 mg/dL — ABNORMAL HIGH (ref 0.0–149.0)
VLDL: 45.2 mg/dL — AB (ref 0.0–40.0)

## 2018-08-04 LAB — LDL CHOLESTEROL, DIRECT: Direct LDL: 123 mg/dL

## 2018-08-04 LAB — COMPREHENSIVE METABOLIC PANEL
ALT: 17 U/L (ref 0–35)
AST: 16 U/L (ref 0–37)
Albumin: 4.3 g/dL (ref 3.5–5.2)
Alkaline Phosphatase: 85 U/L (ref 39–117)
BUN: 23 mg/dL (ref 6–23)
CHLORIDE: 104 meq/L (ref 96–112)
CO2: 31 meq/L (ref 19–32)
CREATININE: 0.77 mg/dL (ref 0.40–1.20)
Calcium: 9.4 mg/dL (ref 8.4–10.5)
GFR: 80.23 mL/min (ref >60.00)
Glucose, Bld: 102 mg/dL — ABNORMAL HIGH (ref 70–99)
Potassium: 4.6 mEq/L (ref 3.5–5.1)
SODIUM: 141 meq/L (ref 135–145)
Total Bilirubin: 0.4 mg/dL (ref 0.2–1.2)
Total Protein: 6.8 g/dL (ref 6.0–8.3)

## 2018-08-04 LAB — TSH: TSH: 2.94 u[IU]/mL (ref 0.35–4.50)

## 2018-08-04 LAB — HEMOGLOBIN A1C: Hgb A1c MFr Bld: 5.5 % (ref 4.6–6.5)

## 2018-08-11 ENCOUNTER — Ambulatory Visit (INDEPENDENT_AMBULATORY_CARE_PROVIDER_SITE_OTHER): Payer: Managed Care, Other (non HMO) | Admitting: Family Medicine

## 2018-08-11 ENCOUNTER — Encounter: Payer: Self-pay | Admitting: Family Medicine

## 2018-08-11 VITALS — BP 128/80 | HR 74 | Temp 98.6°F | Ht 65.0 in | Wt 226.0 lb

## 2018-08-11 DIAGNOSIS — Z0000 Encounter for general adult medical examination without abnormal findings: Secondary | ICD-10-CM | POA: Diagnosis not present

## 2018-08-11 DIAGNOSIS — M25569 Pain in unspecified knee: Secondary | ICD-10-CM

## 2018-08-11 DIAGNOSIS — Z7189 Other specified counseling: Secondary | ICD-10-CM

## 2018-08-11 DIAGNOSIS — E890 Postprocedural hypothyroidism: Secondary | ICD-10-CM

## 2018-08-11 DIAGNOSIS — Z23 Encounter for immunization: Secondary | ICD-10-CM

## 2018-08-11 DIAGNOSIS — R454 Irritability and anger: Secondary | ICD-10-CM

## 2018-08-11 DIAGNOSIS — E785 Hyperlipidemia, unspecified: Secondary | ICD-10-CM

## 2018-08-11 MED ORDER — SYNTHROID 75 MCG PO TABS: ORAL_TABLET | ORAL | 3 refills | Status: DC

## 2018-08-11 MED ORDER — LANSOPRAZOLE 15 MG PO CPDR: 15.0000 mg | DELAYED_RELEASE_CAPSULE | Freq: Every day | ORAL | Status: AC | PRN

## 2018-08-11 MED ORDER — OMEPRAZOLE 20 MG PO CPDR: 20.0000 mg | DELAYED_RELEASE_CAPSULE | Freq: Every day | ORAL | Status: DC | PRN

## 2018-08-11 MED ORDER — SIMVASTATIN 20 MG PO TABS: 20.0000 mg | ORAL_TABLET | Freq: Every day | ORAL | 3 refills | Status: DC

## 2018-08-11 MED ORDER — CITALOPRAM HYDROBROMIDE 20 MG PO TABS: 20.0000 mg | ORAL_TABLET | Freq: Every day | ORAL | 1 refills | Status: DC

## 2018-08-11 NOTE — Patient Instructions (Signed)
Update me in the spring about tapering off the citalopram.   Stop omeprazole, try prevacid instead if needed.  Work on diet and exercise when you retire.   Take care.  Glad to see you.

## 2018-08-11 NOTE — Progress Notes (Signed)
CPE- See plan.  Routine anticipatory guidance given to patient.  See health maintenance.  The possibility exists that previously documented standard health maintenance information may have been brought forward from a previous encounter into this note.  If needed, that same information has been updated to reflect the current situation based on today's encounter.    Tetanus 2015  Flu today Shingles out of stock.   PNA due at 63  Colon 2017  Breast cancer screening- mammogram done 2019 per patient report.  DXA done 2018 Pelvic exam per gyn clinic Advance directive. Husband designated if patient were incapacitated.  [facility].   Diet and exercise d/w pt. "Not good."   Husband was recently hospitalized.  She is trying to make changes with work.  She wants to work on diet and exercise when she gets retired.    Tylenol didn't help joint pain. D/w pt about taking aleve with food, used prn.    Elevated Cholesterol: Using medications without problems:yes Muscle aches: no Diet compliance: encouraged.   Exercise: encouraged  Hypothyroidism. No neck mass, dysphagia.  Compliant.  Labs d/w pt.    Discussed citalopram.  Has been on long term.  Mood is fine now.  She wanted to get off med eventually.  No SI/HI.  We talked about potential taper of citalopram after she retires.  She is retiring in a few months. D/w pt.  She has notified her office.  She is looking forward to retirement and doing other things.    PMH and SH reviewed  Meds, vitals, and allergies reviewed.   ROS: Per HPI.  Unless specifically indicated otherwise in HPI, the patient denies:  General: fever. Eyes: acute vision changes ENT: sore throat Cardiovascular: chest pain Respiratory: SOB GI: vomiting GU: dysuria Musculoskeletal: acute back pain Derm: acute rash Neuro: acute motor dysfunction Psych: worsening mood Endocrine: polydipsia Heme: bleeding Allergy: hayfever  GEN: nad,  alert and oriented HEENT: mucous membranes moist NECK: supple w/o LA CV: rrr. PULM: ctab, no inc wob ABD: soft, +bs EXT: no edema SKIN: no acute rash Speech and affect within normal limits.

## 2018-08-13 NOTE — Assessment & Plan Note (Signed)
Labs discussed with patient.  Continue as is.  She agrees.

## 2018-08-13 NOTE — Assessment & Plan Note (Signed)
Advance directive-Husband designated if patient were incapacitated.   

## 2018-08-13 NOTE — Assessment & Plan Note (Signed)
Tetanus 2015  Flu today Shingles out of stock.   PNA due at 63  Colon 2017  Breast cancer screening- mammogram done 2019 per patient report.  DXA done 2018 Pelvic exam per gyn clinic Advance directive. Husband designated if patient were incapacitated.  [facility].   Diet and exercise d/w pt. "Not good."   Husband was recently hospitalized.  She is trying to make changes with work.  She wants to work on diet and exercise when she gets retired.

## 2018-08-13 NOTE — Assessment & Plan Note (Signed)
Continue work on diet and exercise.  No change in statin at this point.  She agrees.

## 2018-08-13 NOTE — Assessment & Plan Note (Signed)
Continue citalopram for now.  Discussed not taking omeprazole while on that medication.  She can use Prevacid instead.  We talked about potential taper of citalopram after she retires in the spring.  She agrees.  Update me as needed.

## 2018-08-13 NOTE — Assessment & Plan Note (Signed)
She can try Aleve with food with routine NSAID cautions given.  Update me as needed.  She agrees.  Tylenol did not help.

## 2018-08-31 ENCOUNTER — Encounter: Payer: Self-pay | Admitting: Family Medicine

## 2018-10-19 ENCOUNTER — Encounter: Payer: Self-pay | Admitting: Family Medicine

## 2018-10-19 ENCOUNTER — Ambulatory Visit (INDEPENDENT_AMBULATORY_CARE_PROVIDER_SITE_OTHER): Payer: Managed Care, Other (non HMO) | Admitting: Family Medicine

## 2018-10-19 DIAGNOSIS — R059 Cough, unspecified: Secondary | ICD-10-CM

## 2018-10-19 DIAGNOSIS — R05 Cough: Secondary | ICD-10-CM | POA: Diagnosis not present

## 2018-10-19 MED ORDER — AMOXICILLIN-POT CLAVULANATE 875-125 MG PO TABS: 1.0000 | ORAL_TABLET | Freq: Two times a day (BID) | ORAL | 0 refills | Status: DC

## 2018-10-19 MED ORDER — BENZONATATE 200 MG PO CAPS: 200.0000 mg | ORAL_CAPSULE | Freq: Three times a day (TID) | ORAL | 1 refills | Status: DC | PRN

## 2018-10-19 NOTE — Patient Instructions (Signed)
Presumed sinus infection.  Rest and fluids.  Start augmentin.  Use tessalon if needed for cough.  Update clinic as needed.  Take care.  Glad to see you.

## 2018-10-19 NOTE — Assessment & Plan Note (Signed)
ctab Presumed sinus infection.  Rest and fluids.  Start augmentin.  Use tessalon if needed for cough.  Update clinic as needed.  She agrees.  Nontoxic.

## 2018-10-19 NOTE — Progress Notes (Signed)
Sick for 3 weeks.  Initially with L facial pain, L ear, L upper tooth pain.  She took zithromax, no help.  Congested.  Not much rhinorrhea.  Using nasal saline.  No sputum.  Coughing but dry. Still with B maxillary pressure. L posterior neck is still a little tender.  HA by the end of the day.  No fevers known in the last week.  No vomiting, no diarrhea.  No rash.  Voice is still hoarse.   Still most bothered by cough and that isn't getting better.     Meds, vitals, and allergies reviewed.   ROS: Per HPI unless specifically indicated in ROS section   GEN: nad, alert and oriented HEENT: mucous membranes moist, tm w/o erythema, nasal exam w/o erythema, clear discharge noted,  OP with cobblestoning NECK: supple w/o LA, L posterior neck muscles slightly sore.   CV: rrr.   PULM: ctab, no inc wob EXT: no edema SKIN: well perfused.  Maxillary sinuses with pressure/senstivity to percussion.

## 2019-02-05 ENCOUNTER — Other Ambulatory Visit: Payer: Self-pay | Admitting: Family Medicine

## 2019-08-30 ENCOUNTER — Other Ambulatory Visit: Payer: Managed Care, Other (non HMO)

## 2019-09-06 ENCOUNTER — Encounter: Payer: Managed Care, Other (non HMO) | Admitting: Family Medicine

## 2019-09-06 ENCOUNTER — Telehealth: Payer: Self-pay

## 2019-09-06 NOTE — Telephone Encounter (Signed)
LVM w COVID screen, front door and back lab info 12.10.2020 TLJ

## 2019-09-10 ENCOUNTER — Other Ambulatory Visit: Payer: Self-pay

## 2019-09-10 ENCOUNTER — Other Ambulatory Visit (INDEPENDENT_AMBULATORY_CARE_PROVIDER_SITE_OTHER): Payer: Medicare Other

## 2019-09-10 ENCOUNTER — Other Ambulatory Visit: Payer: Self-pay | Admitting: Family Medicine

## 2019-09-10 DIAGNOSIS — R739 Hyperglycemia, unspecified: Secondary | ICD-10-CM

## 2019-09-10 DIAGNOSIS — E890 Postprocedural hypothyroidism: Secondary | ICD-10-CM

## 2019-09-10 DIAGNOSIS — E785 Hyperlipidemia, unspecified: Secondary | ICD-10-CM

## 2019-09-10 LAB — LIPID PANEL
Cholesterol: 184 mg/dL (ref 0–200)
HDL: 40.3 mg/dL (ref >39.00)
LDL Cholesterol: 115 mg/dL — ABNORMAL HIGH (ref 0–99)
NonHDL: 143.34
Total CHOL/HDL Ratio: 5
Triglycerides: 142 mg/dL (ref 0.0–149.0)
VLDL: 28.4 mg/dL (ref 0.0–40.0)

## 2019-09-10 LAB — COMPREHENSIVE METABOLIC PANEL
ALT: 24 U/L (ref 0–35)
AST: 20 U/L (ref 0–37)
Albumin: 4.1 g/dL (ref 3.5–5.2)
Alkaline Phosphatase: 93 U/L (ref 39–117)
BUN: 17 mg/dL (ref 6–23)
CO2: 27 mEq/L (ref 19–32)
Calcium: 9.2 mg/dL (ref 8.4–10.5)
Chloride: 104 mEq/L (ref 96–112)
Creatinine, Ser: 0.78 mg/dL (ref 0.40–1.20)
GFR: 74.12 mL/min (ref >60.00)
Glucose, Bld: 118 mg/dL — ABNORMAL HIGH (ref 70–99)
Potassium: 4.1 mEq/L (ref 3.5–5.1)
Sodium: 140 mEq/L (ref 135–145)
Total Bilirubin: 0.8 mg/dL (ref 0.2–1.2)
Total Protein: 7 g/dL (ref 6.0–8.3)

## 2019-09-10 LAB — TSH: TSH: 4.94 u[IU]/mL — ABNORMAL HIGH (ref 0.35–4.50)

## 2019-09-10 LAB — HEMOGLOBIN A1C: Hgb A1c MFr Bld: 5.6 % (ref 4.6–6.5)

## 2019-09-15 ENCOUNTER — Other Ambulatory Visit: Payer: Self-pay | Admitting: Family Medicine

## 2019-09-17 ENCOUNTER — Other Ambulatory Visit: Payer: Self-pay

## 2019-09-17 ENCOUNTER — Encounter: Payer: Self-pay | Admitting: Family Medicine

## 2019-09-17 ENCOUNTER — Ambulatory Visit (INDEPENDENT_AMBULATORY_CARE_PROVIDER_SITE_OTHER): Payer: Medicare Other | Admitting: Family Medicine

## 2019-09-17 VITALS — BP 150/76 | HR 87 | Temp 97.5°F | Ht 65.0 in | Wt 237.2 lb

## 2019-09-17 DIAGNOSIS — Z0000 Encounter for general adult medical examination without abnormal findings: Secondary | ICD-10-CM | POA: Diagnosis not present

## 2019-09-17 DIAGNOSIS — M25519 Pain in unspecified shoulder: Secondary | ICD-10-CM | POA: Diagnosis not present

## 2019-09-17 DIAGNOSIS — E890 Postprocedural hypothyroidism: Secondary | ICD-10-CM

## 2019-09-17 DIAGNOSIS — Z23 Encounter for immunization: Secondary | ICD-10-CM

## 2019-09-17 DIAGNOSIS — E785 Hyperlipidemia, unspecified: Secondary | ICD-10-CM | POA: Diagnosis not present

## 2019-09-17 DIAGNOSIS — Z7189 Other specified counseling: Secondary | ICD-10-CM

## 2019-09-17 DIAGNOSIS — R454 Irritability and anger: Secondary | ICD-10-CM

## 2019-09-17 MED ORDER — CITALOPRAM HYDROBROMIDE 10 MG PO TABS: 10.0000 mg | ORAL_TABLET | Freq: Every day | ORAL | 3 refills | Status: DC

## 2019-09-17 MED ORDER — LEVOTHYROXINE SODIUM 88 MCG PO TABS: 88.0000 ug | ORAL_TABLET | Freq: Every day | ORAL | 3 refills | Status: DC

## 2019-09-17 MED ORDER — SIMVASTATIN 20 MG PO TABS: ORAL_TABLET | ORAL | 3 refills | Status: DC

## 2019-09-17 MED ORDER — CITALOPRAM HYDROBROMIDE 20 MG PO TABS: 20.0000 mg | ORAL_TABLET | ORAL | Status: DC

## 2019-09-17 NOTE — Patient Instructions (Addendum)
Use the shoulder exercises and update me as needed.  Higher dose of thyroid medicine.  Recheck TSH at nonfasting lab visit in about 2-3 months.  Change to 10mg  citalopram daily.  Update me as needed.  Take care.  Glad to see you.   PNA 23 shot today.  Thanks for getting a flu shot.

## 2019-09-17 NOTE — Progress Notes (Signed)
This visit occurred during the SARS-CoV-2 public health emergency.  Safety protocols were in place, including screening questions prior to the visit, additional usage of staff PPE, and extensive cleaning of exam room while observing appropriate contact time as indicated for disinfecting solutions.  I have personally reviewed the Medicare Annual Wellness questionnaire and have noted 1. The patient's medical and social history 2. Their use of alcohol, tobacco or illicit drugs 3. Their current medications and supplements 4. The patient's functional ability including ADL's, fall risks, home safety risks and hearing or visual             impairment. 5. Diet and physical activities 6. Evidence for depression or mood disorders  The patients weight, height, BMI have been recorded in the chart and visual acuity is per eye clinic.  I have made referrals, counseling and provided education to the patient based review of the above and I have provided the pt with a written personalized care plan for preventive services.  Provider list updated- see scanned forms.  Routine anticipatory guidance given to patient.  See health maintenance. The possibility exists that previously documented standard health maintenance information may have been brought forward from a previous encounter into this note.  If needed, that same information has been updated to reflect the current situation based on today's encounter.    Flu 2020 Shingles previously done PNA 2012 Tetanus 2015 Colon cancer screening with colonoscopy 2017 Breast cancer screening 2019.  Discussed with patient. Bone density test 2018. Advance directive-husband designated if patient were incapacitated. Cognitive function addressed- see scanned forms- and if abnormal then additional documentation follows.    Whisper hearing test intact bilaterally but with decreased hearing in left ear compared to right at baseline See vision screen. EKG done at office  visit.  Elevated Cholesterol: Using medications without problems:yes Muscle aches: likely not from statin.  Diet compliance: encouraged.  Exercise: encouraged  Mood d/w pt.  Still on SSRI.  She is retired and covid stressors d/w pt.  Compliant.  No SI/HI.  She wanted to wean citalopram, currently down to 20 mg every other day.  Discussed changing to 10 mg daily for now.  Hypothyroidism.  Compliant.  No ADE on med.  No neck mass.  Labs d/w pt.   L shoulder pain noted at night. Taking aleve for pain.  Pain with int rotation.  Pain sleeping on L side at night.  Noted in the last 1-2 months.  No trauma.    PMH and SH reviewed  Meds, vitals, and allergies reviewed.   ROS: Per HPI.  Unless specifically indicated otherwise in HPI, the patient denies:  General: fever. Eyes: acute vision changes ENT: sore throat Cardiovascular: chest pain Respiratory: SOB GI: vomiting GU: dysuria Musculoskeletal: acute back pain Derm: acute rash Neuro: acute motor dysfunction Psych: worsening mood Endocrine: polydipsia Heme: bleeding Allergy: hayfever  GEN: nad, alert and oriented HEENT: ncat NECK: supple w/o LA CV: rrr. PULM: ctab, no inc wob ABD: soft, +bs EXT: no edema SKIN: no acute rash  Left shoulder with pain on internal rotation.  Less pain on external rotation.  No arm drop.  AC joint nontender on testing.  Distally neurovascular intact.  Internal rotation pain improved with scapular manipulation.  Health Maintenance  Topic Date Due  . MAMMOGRAM  07/24/2020  . PNA vac Low Risk Adult (2 of 2 - PCV13) 09/16/2020  . TETANUS/TDAP  11/30/2023  . COLONOSCOPY  02/02/2026  . INFLUENZA VACCINE  Completed  . DEXA  SCAN  Completed  . Hepatitis C Screening  Completed  . HIV Screening  Completed

## 2019-09-18 ENCOUNTER — Other Ambulatory Visit: Payer: Self-pay | Admitting: Family Medicine

## 2019-09-18 NOTE — Telephone Encounter (Signed)
Electronic refill request. Citalopram The original prescription was discontinued on 09/17/2019 by Tonia Ghent, MD for the following reason: Reorder. Renewing this prescription may not be appropriate. Last office visit:   09/17/2019 Last Filled:    90 tablet 3 09/17/2019   Please advise.

## 2019-09-19 DIAGNOSIS — M25519 Pain in unspecified shoulder: Secondary | ICD-10-CM | POA: Insufficient documentation

## 2019-09-19 NOTE — Assessment & Plan Note (Signed)
Likely rotator cuff irritation.  Discussed options.  She can try home exercise program and update me as needed.  She agrees.  Handout given to patient, discussed.  Exercises explained.

## 2019-09-19 NOTE — Assessment & Plan Note (Signed)
Increase replacement to 88 mcg a day and recheck labs in 2-3 months.  Discussed with patient.  She agrees.

## 2019-09-19 NOTE — Assessment & Plan Note (Signed)
Flu 2020 Shingles previously done PNA 2012 Tetanus 2015 Colon cancer screening with colonoscopy 2017 Breast cancer screening 2019.  Discussed with patient. Bone density test 2018. Flu 2020 Shingles previously done PNA 2012 Tetanus 2015 Colon cancer screening with colonoscopy 2017 Breast cancer screening 2019.  Discussed with patient. Bone density test 2018. Advance directive-husband designated if patient were incapacitated. Cognitive function addressed- see scanned forms- and if abnormal then additional documentation follows.   Cognitive function addressed- see scanned forms- and if abnormal then additional documentation follows.

## 2019-09-19 NOTE — Assessment & Plan Note (Signed)
Continue 10 mg daily for now.  Prescription sent.  She can attempt to wean in the future as tolerated.

## 2019-09-19 NOTE — Assessment & Plan Note (Signed)
Lipids discussed with patient.  She is likely not having aches from the statin.  Discussed.  Continue work on diet and exercise.  Update me as needed.  She agrees.

## 2019-09-19 NOTE — Assessment & Plan Note (Signed)
Advance directive- husband designated if patient were incapacitated.  

## 2019-09-19 NOTE — Telephone Encounter (Signed)
This was already addressed with the prev dose change.  I denied this. Thanks.

## 2019-10-27 ENCOUNTER — Ambulatory Visit: Payer: Medicare Other

## 2019-11-01 ENCOUNTER — Ambulatory Visit: Payer: Medicare Other

## 2019-11-04 ENCOUNTER — Ambulatory Visit: Payer: Medicare Other | Attending: Internal Medicine

## 2019-11-04 DIAGNOSIS — Z23 Encounter for immunization: Secondary | ICD-10-CM | POA: Insufficient documentation

## 2019-11-04 NOTE — Progress Notes (Signed)
   Covid-19 Vaccination Clinic  Name:  FLORENDA DELANY    MRN: [id number] DOB: 1956-06-03  11/04/2019  Ms. Smigel was observed post Covid-19 immunization for 15 minutes without incidence. She was provided with Vaccine Information Sheet and instruction to access the V-Safe system.   Ms. Boney was instructed to call 911 with any severe reactions post vaccine: Marland Kitchen Difficulty breathing  . Swelling of your face and throat  . A fast heartbeat  . A bad rash all over your body  . Dizziness and weakness    Immunizations Administered    Name Date Dose VIS Date Route   Pfizer COVID-19 Vaccine 11/04/2019  8:30 AM 0.3 mL 09/07/2019 Intramuscular   Manufacturer: [place]   Lot: [id number]   [place]: [id number]

## 2019-11-28 ENCOUNTER — Ambulatory Visit: Payer: Medicare Other | Attending: Internal Medicine

## 2019-11-28 DIAGNOSIS — Z23 Encounter for immunization: Secondary | ICD-10-CM

## 2019-11-28 NOTE — Progress Notes (Signed)
   Covid-19 Vaccination Clinic  Name:  Sharon Benjamin    MRN: [id number] DOB: 05-25-1956  11/28/2019  Sharon Benjamin was observed post Covid-19 immunization for 15 minutes without incident. She was provided with Vaccine Information Sheet and instruction to access the V-Safe system.   Sharon Benjamin was instructed to call 911 with any severe reactions post vaccine: Marland Kitchen Difficulty breathing  . Swelling of face and throat  . A fast heartbeat  . A bad rash all over body  . Dizziness and weakness   Immunizations Administered    Name Date Dose VIS Date Route   Pfizer COVID-19 Vaccine 11/28/2019  3:42 PM 0.3 mL 09/07/2019 Intramuscular   Manufacturer: [place]   Lot: [id number]   [place]: [id number]

## 2020-08-01 ENCOUNTER — Other Ambulatory Visit: Payer: Self-pay | Admitting: Family Medicine

## 2020-08-01 NOTE — Telephone Encounter (Signed)
Pharmacy requests refill on: Synthroid 88 mcg  LAST REFILL: 09/17/2019 LAST OV: 09/17/2019 NEXT OV: 08/12/2020 PHARMACY: [id number] [place],    Last TSH (09/10/19): 4.94

## 2020-08-05 ENCOUNTER — Ambulatory Visit (INDEPENDENT_AMBULATORY_CARE_PROVIDER_SITE_OTHER): Payer: Medicare Other

## 2020-08-05 DIAGNOSIS — Z0000 Encounter for general adult medical examination without abnormal findings: Secondary | ICD-10-CM

## 2020-08-05 NOTE — Progress Notes (Signed)
PCP notes:  Health Maintenance: Mammo- due Dexa- due   Abnormal Screenings: none   Patient concerns: none   Nurse concerns: none   Next PCP appt: 08/12/2020 @ 9:30 am

## 2020-08-05 NOTE — Patient Instructions (Signed)
Sharon Benjamin , Thank you for taking time to come for your Medicare Wellness Visit. I appreciate your ongoing commitment to your health goals. Please review the following plan we discussed and let me know if I can assist you in the future.   Screening recommendations/referrals: Colonoscopy: Up to date, completed 02/03/2016, due 01/2026 Mammogram: due, Please call and schedule appointment Bone Density: due, Please call and schedule appointment  Recommended yearly ophthalmology/optometry visit for glaucoma screening and checkup Recommended yearly dental visit for hygiene and checkup  Vaccinations: Influenza vaccine: Up to date, completed 07/07/2020, due 04/2021 Pneumococcal vaccine: Up to date, completed 09/17/2019, due 09/16/2020 Tdap vaccine: Up to date, completed 11/29/2013, due 11/2023 Shingles vaccine: Completed series   Covid-19:Completed series  Advanced directives: Advance directive discussed with you today. Even though you declined this today please call our office should you change your mind and we can give you the proper paperwork for you to fill out.  Conditions/risks identified: hyperlipidemia  Next appointment: Follow up in one year for your annual wellness visit    Preventive Care 65 Years and Older, Female Preventive care refers to lifestyle choices and visits with your health care provider that can promote health and wellness. What does preventive care include?  A yearly physical exam. This is also called an annual well check.  Dental exams once or twice a year.  Routine eye exams. Ask your health care provider how often you should have your eyes checked.  Personal lifestyle choices, including:  Daily care of your teeth and gums.  Regular physical activity.  Eating a healthy diet.  Avoiding tobacco and drug use.  Limiting alcohol use.  Practicing safe sex.  Taking low-dose aspirin every day.  Taking vitamin and mineral supplements as recommended by your health  care provider. What happens during an annual well check? The services and screenings done by your health care provider during your annual well check will depend on your age, overall health, lifestyle risk factors, and family history of disease. Counseling  Your health care provider may ask you questions about your:  Alcohol use.  Tobacco use.  Drug use.  Emotional well-being.  Home and relationship well-being.  Sexual activity.  Eating habits.  History of falls.  Memory and ability to understand (cognition).  Work and work Statistician.  Reproductive health. Screening  You may have the following tests or measurements:  Height, weight, and BMI.  Blood pressure.  Lipid and cholesterol levels. These may be checked every 5 years, or more frequently if you are over 73 years old.  Skin check.  Lung cancer screening. You may have this screening every year starting at age 53 if you have a 30-pack-year history of smoking and currently smoke or have quit within the past 15 years.  Fecal occult blood test (FOBT) of the stool. You may have this test every year starting at age 58.  Flexible sigmoidoscopy or colonoscopy. You may have a sigmoidoscopy every 5 years or a colonoscopy every 10 years starting at age 85.  Hepatitis C blood test.  Hepatitis B blood test.  Sexually transmitted disease (STD) testing.  Diabetes screening. This is done by checking your blood sugar (glucose) after you have not eaten for a while (fasting). You may have this done every 1-3 years.  Bone density scan. This is done to screen for osteoporosis. You may have this done starting at age 10.  Mammogram. This may be done every 1-2 years. Talk to your health care provider about how  often you should have regular mammograms. Talk with your health care provider about your test results, treatment options, and if necessary, the need for more tests. Vaccines  Your health care provider may recommend certain  vaccines, such as:  Influenza vaccine. This is recommended every year.  Tetanus, diphtheria, and acellular pertussis (Tdap, Td) vaccine. You may need a Td booster every 10 years.  Zoster vaccine. You may need this after age 15.  Pneumococcal 13-valent conjugate (PCV13) vaccine. One dose is recommended after age 78.  Pneumococcal polysaccharide (PPSV23) vaccine. One dose is recommended after age 56. Talk to your health care provider about which screenings and vaccines you need and how often you need them. This information is not intended to replace advice given to you by your health care provider. Make sure you discuss any questions you have with your health care provider. Document Released: 10/10/2015 Document Revised: 06/02/2016 Document Reviewed: 07/15/2015 Elsevier Interactive Patient Education  2017 [place] Prevention in the Home Falls can cause injuries. They can happen to people of all ages. There are many things you can do to make your home safe and to help prevent falls. What can I do on the outside of my home?  Regularly fix the edges of walkways and driveways and fix any cracks.  Remove anything that might make you trip as you walk through a door, such as a raised step or threshold.  Trim any bushes or trees on the path to your home.  Use bright outdoor lighting.  Clear any walking paths of anything that might make someone trip, such as rocks or tools.  Regularly check to see if handrails are loose or broken. Make sure that both sides of any steps have handrails.  Any raised decks and porches should have guardrails on the edges.  Have any leaves, snow, or ice cleared regularly.  Use sand or salt on walking paths during winter.  Clean up any spills in your garage right away. This includes oil or grease spills. What can I do in the bathroom?  Use night lights.  Install grab bars by the toilet and in the tub and shower. Do not use towel bars as grab  bars.  Use non-skid mats or decals in the tub or shower.  If you need to sit down in the shower, use a plastic, non-slip stool.  Keep the floor dry. Clean up any water that spills on the floor as soon as it happens.  Remove soap buildup in the tub or shower regularly.  Attach bath mats securely with double-sided non-slip rug tape.  Do not have throw rugs and other things on the floor that can make you trip. What can I do in the bedroom?  Use night lights.  Make sure that you have a light by your bed that is easy to reach.  Do not use any sheets or blankets that are too big for your bed. They should not hang down onto the floor.  Have a firm chair that has side arms. You can use this for support while you get dressed.  Do not have throw rugs and other things on the floor that can make you trip. What can I do in the kitchen?  Clean up any spills right away.  Avoid walking on wet floors.  Keep items that you use a lot in easy-to-reach places.  If you need to reach something above you, use a strong step stool that has a grab bar.  Keep electrical  cords out of the way.  Do not use floor polish or wax that makes floors slippery. If you must use wax, use non-skid floor wax.  Do not have throw rugs and other things on the floor that can make you trip. What can I do with my stairs?  Do not leave any items on the stairs.  Make sure that there are handrails on both sides of the stairs and use them. Fix handrails that are broken or loose. Make sure that handrails are as long as the stairways.  Check any carpeting to make sure that it is firmly attached to the stairs. Fix any carpet that is loose or worn.  Avoid having throw rugs at the top or bottom of the stairs. If you do have throw rugs, attach them to the floor with carpet tape.  Make sure that you have a light switch at the top of the stairs and the bottom of the stairs. If you do not have them, ask someone to add them for  you. What else can I do to help prevent falls?  Wear shoes that:  Do not have high heels.  Have rubber bottoms.  Are comfortable and fit you well.  Are closed at the toe. Do not wear sandals.  If you use a stepladder:  Make sure that it is fully opened. Do not climb a closed stepladder.  Make sure that both sides of the stepladder are locked into place.  Ask someone to hold it for you, if possible.  Clearly mark and make sure that you can see:  Any grab bars or handrails.  First and last steps.  Where the edge of each step is.  Use tools that help you move around (mobility aids) if they are needed. These include:  Canes.  Walkers.  Scooters.  Crutches.  Turn on the lights when you go into a dark area. Replace any light bulbs as soon as they burn out.  Set up your furniture so you have a clear path. Avoid moving your furniture around.  If any of your floors are uneven, fix them.  If there are any pets around you, be aware of where they are.  Review your medicines with your doctor. Some medicines can make you feel dizzy. This can increase your chance of falling. Ask your doctor what other things that you can do to help prevent falls. This information is not intended to replace advice given to you by your health care provider. Make sure you discuss any questions you have with your health care provider. Document Released: 07/10/2009 Document Revised: 02/19/2016 Document Reviewed: 10/18/2014 Elsevier Interactive Patient Education  2017 [employer].

## 2020-08-05 NOTE — Progress Notes (Signed)
Subjective:   Sharon Benjamin is a 64 y.o. female who presents for an Initial Medicare Annual Wellness Visit.  Review of Systems: N/A      I connected with the patient today by telephone and verified that I am speaking with the correct person using two identifiers. Location patient: home Location nurse: work Persons participating in the telephone visit: patient, nurse.   I discussed the limitations, risks, security and privacy concerns of performing an evaluation and management service by telephone and the availability of in person appointments. I also discussed with the patient that there may be a patient responsible charge related to this service. The patient expressed understanding and verbally consented to this telephonic visit.        Cardiac Risk Factors include: advanced age (>76men, >91 women);Other (see comment), Risk factor comments: hyperlipidemia     Objective:    Today's Vitals   There is no height or weight on file to calculate BMI.  Advanced Directives 08/05/2020 02/03/2016 01/13/2016  Does Patient Have a Medical Advance Directive? No No No  Would patient like information on creating a medical advance directive? No - Patient declined - No - patient declined information    Current Medications (verified) Outpatient Encounter Medications as of 08/05/2020  Medication Sig  . citalopram (CELEXA) 10 MG tablet Take 1 tablet (10 mg total) by mouth daily.  . fexofenadine (ALLEGRA) 180 MG tablet Take 180 mg by mouth daily as needed.   . lansoprazole (PREVACID) 15 MG capsule Take 1 capsule (15 mg total) by mouth daily as needed.  . naproxen sodium (RA NAPROXEN SODIUM) 220 MG tablet Take by mouth daily as needed.   . Omega-3 Fatty Acids (FISH OIL) 1000 MG CAPS Take by mouth daily.    . simvastatin (ZOCOR) 20 MG tablet TAKE 1 TABLET BY MOUTH EVERYDAY AT BEDTIME  . SYNTHROID 88 MCG tablet TAKE 1 TABLET BY MOUTH EVERY DAY   No facility-administered encounter medications on file  as of 08/05/2020.    Allergies (verified) Lipitor [atorvastatin]   History: Past Medical History:  Diagnosis Date  . Allergy   . Arthritis   . Cataract    bi;lateral  . Diverticulosis of colon   . GERD (gastroesophageal reflux disease)   . Hyperlipidemia   . Insomnia    with prn ambien use, rare as of 2013  . Thyroid disease    Past Surgical History:  Procedure Laterality Date  . Abd sonogram - left renal cyst, GB polyp  03/96  . ABDOMINAL HYSTERECTOMY    . CATARACT EXTRACTION Bilateral   . COLONOSCOPY    . CT abd - left renal cyst only  03/96  . Left knee arthroscopy  02/2005  . TONSILLECTOMY AND ADENOIDECTOMY  Age 24  . UGI - hiatal hernia  03/96   Family History  Problem Relation Age of Onset  . Hyperlipidemia Mother   . Hypertension Mother   . Asthma Mother   . Arthritis Mother        Psoriatic arthritis  . Cancer Father        Esophagus, was a smoker and used ETOH  . Alcohol abuse Father   . Esophageal cancer Father   . Heart disease Maternal Uncle        CHF  . Heart disease Maternal Grandmother        MI  . Heart disease Other        Angina  . Cancer Maternal Aunt  Ovarian  . Cancer Paternal Aunt        Breast  . Breast cancer Paternal Aunt   . Cancer Paternal Uncle        Lung cancer  . Diabetes Paternal Grandmother   . Cancer Paternal Aunt        Liver cancer  . Cancer Paternal Uncle        Lung cancer  . Colon cancer Neg Hx   . Colon polyps Neg Hx   . Rectal cancer Neg Hx   . Stomach cancer Neg Hx    Social History   Socioeconomic History  . Marital status: Married    Spouse name: Not on file  . Number of children: 0  . Years of education: Not on file  . Highest education level: Not on file  Occupational History  . Occupation: Aeronautical engineer: [employer]  Tobacco Use  . Smoking status: Former Research scientist (life sciences)  . Smokeless tobacco: Never Used  . Tobacco comment: quit over 30 years ago  Substance and Sexual Activity  .  Alcohol use: Yes    Alcohol/week: 0.0 standard drinks    Comment: rarely  . Drug use: No  . Sexual activity: Not on file  Other Topics Concern  . Not on file  Social History Narrative   Retired as Optometrist for [employer]   Married 1983   Social Determinants of Health   Financial Resource Strain: Low Risk   . Difficulty of Paying Living Expenses: Not hard at all  Food Insecurity: No Food Insecurity  . Worried About Charity fundraiser in the Last Year: Never true  . Ran Out of Food in the Last Year: Never true  Transportation Needs: No Transportation Needs  . Lack of Transportation (Medical): No  . Lack of Transportation (Non-Medical): No  Physical Activity: Inactive  . Days of Exercise per Week: 0 days  . Minutes of Exercise per Session: 0 min  Stress: No Stress Concern Present  . Feeling of Stress : Not at all  Social Connections:   . Frequency of Communication with Friends and Family: Not on file  . Frequency of Social Gatherings with Friends and Family: Not on file  . Attends Religious Services: Not on file  . Active Member of Clubs or Organizations: Not on file  . Attends Archivist Meetings: Not on file  . Marital Status: Not on file    Tobacco Counseling Counseling given: Not Answered Comment: quit over 30 years ago   Clinical Intake:  Pre-visit preparation completed: Yes  Pain : No/denies pain     Nutritional Risks: None Diabetes: No  How often do you need to have someone help you when you read instructions, pamphlets, or other written materials from your doctor or pharmacy?: 1 - Never What is the last grade level you completed in school?: college  Diabetic: No Nutrition Risk Assessment:  Has the patient had any N/V/D within the last 2 months?  No  Does the patient have any non-healing wounds?  No  Has the patient had any unintentional weight loss or weight gain?  No   Diabetes:  Is the patient diabetic?  No  If diabetic, was a  CBG obtained today?  N/A Did the patient bring in their glucometer from home?  N/A How often do you monitor your CBG's? N/A.   Financial Strains and Diabetes Management:  Are you having any financial strains with the device, your supplies or your medication? N/A.  Does the patient want to be seen by Chronic Care Management for management of their diabetes?  N/A Would the patient like to be referred to a Nutritionist or for Diabetic Management?  N/A   Interpreter Needed?: No  Information entered by :: CJohnson, LPN   Activities of Daily Living In your present state of health, do you have any difficulty performing the following activities: 08/05/2020  Hearing? Y  Comment wears hearing aids  Vision? N  Difficulty concentrating or making decisions? N  Walking or climbing stairs? N  Dressing or bathing? N  Doing errands, shopping? N  Preparing Food and eating ? N  Using the Toilet? N  In the past six months, have you accidently leaked urine? N  Do you have problems with loss of bowel control? N  Managing your Medications? N  Managing your Finances? N  Housekeeping or managing your Housekeeping? N  Some recent data might be hidden    Patient Care Team: Tonia Ghent, MD as PCP - General (Family Medicine)  Indicate any recent Medical Services you may have received from other than [facility] providers in the past year (date may be approximate).     Assessment:   This is a routine wellness examination for Sharon Benjamin.  Hearing/Vision screen  Hearing Screening   125Hz  250Hz  500Hz  1000Hz  2000Hz  3000Hz  4000Hz  6000Hz  8000Hz   Right ear:           Left ear:           Vision Screening Comments: Patient gets annual eye exams  Dietary issues and exercise activities discussed: Current Exercise Habits: The patient does not participate in regular exercise at present, Exercise limited by: None identified  Goals    . Patient Stated     08/05/2020, I will maintain and continue medications as  prescribed.       Depression Screen PHQ 2/9 Scores 08/05/2020 09/17/2019 08/11/2018 07/25/2017  PHQ - 2 Score 0 0 0 0  PHQ- 9 Score 0 - - -    Fall Risk Fall Risk  08/05/2020 09/17/2019 07/25/2017  Falls in the past year? 0 0 No  Number falls in past yr: 0 - -  Injury with Fall? 0 - -  Risk for fall due to : No Fall Risks - -  Follow up Falls evaluation completed;Falls prevention discussed - -    Any stairs in or around the home? Yes  If so, are there any without handrails? No  Home free of loose throw rugs in walkways, pet beds, electrical cords, etc? Yes  Adequate lighting in your home to reduce risk of falls? Yes   ASSISTIVE DEVICES UTILIZED TO PREVENT FALLS:  Life alert? No Use of a cane, walker or w/c? No  Grab bars in the bathroom? No  Shower chair or bench in shower? No  Elevated toilet seat or a handicapped toilet? No   TIMED UP AND GO:  Was the test performed? N/A, telephonic visit .    Cognitive Function: MMSE - Mini Mental State Exam 08/05/2020  Orientation to time 5  Orientation to Place 5  Registration 3  Attention/ Calculation 5  Recall 3  Language- repeat 1       Mini Cog  Mini-Cog screen was completed. Maximum score is 22. A value of 0 denotes this part of the MMSE was not completed or the patient failed this part of the Mini-Cog screening.  Immunizations Immunization History  Administered Date(s) Administered  . Fluad Quad(high Dose 65+) 07/07/2020  .  Influenza,inj,Quad PF,6+ Mos 07/25/2017, 08/11/2018, 06/12/2019  . Influenza-Unspecified 07/06/2012  . PFIZER SARS-COV-2 Vaccination 11/04/2019, 11/28/2019, 06/23/2020  . Pneumococcal Polysaccharide-23 09/17/2019  . Tdap 11/29/2013  . Zoster Recombinat (Shingrix) 06/12/2019, 08/16/2019    TDAP status: Up to date Flu Vaccine status: Up to date Pneumococcal vaccine status: Up to date Covid-19 vaccine status: Completed vaccines  Qualifies for Shingles Vaccine? Yes   Zostavax completed No     Shingrix Completed?: Yes  Screening Tests Health Maintenance  Topic Date Due  . MAMMOGRAM  07/24/2020  . PNA vac Low Risk Adult (2 of 2 - PCV13) 09/16/2020  . TETANUS/TDAP  11/30/2023  . COLONOSCOPY  02/02/2026  . INFLUENZA VACCINE  Completed  . DEXA SCAN  Completed  . COVID-19 Vaccine  Completed  . Hepatitis C Screening  Completed  . HIV Screening  Completed    Health Maintenance  Health Maintenance Due  Topic Date Due  . MAMMOGRAM  07/24/2020    Colorectal cancer screening: Completed 02/03/2016. Repeat every 10 years Mammogram status: due, Patient states she will call and schedule appointment Bone Density status: due, Patient states she will call and schedule appointment  Lung Cancer Screening: (Low Dose CT Chest recommended if Age 50-80 years, 30 pack-year currently smoking OR have quit w/in 15years.) does not qualify.    Additional Screening:  Hepatitis C Screening: does qualify; Completed 09/27/1998  Vision Screening: Recommended annual ophthalmology exams for early detection of glaucoma and other disorders of the eye. Is the patient up to date with their annual eye exam?  Yes  Who is the provider or what is the name of the office in which the patient attends annual eye exams? Dr. Gershon Crane If pt is not established with a provider, would they like to be referred to a provider to establish care? No .   Dental Screening: Recommended annual dental exams for proper oral hygiene  Community Resource Referral / Chronic Care Management: CRR required this visit?  No   CCM required this visit?  No      Plan:     I have personally reviewed and noted the following in the patient's chart:   . Medical and social history . Use of alcohol, tobacco or illicit drugs  . Current medications and supplements . Functional ability and status . Nutritional status . Physical activity . Advanced directives . List of other physicians . Hospitalizations, surgeries, and ER visits in  previous 12 months . Vitals . Screenings to include cognitive, depression, and falls . Referrals and appointments  In addition, I have reviewed and discussed with patient certain preventive protocols, quality metrics, and best practice recommendations. A written personalized care plan for preventive services as well as general preventive health recommendations were provided to patient.   Due to this being a telephonic visit, the after visit summary with patients personalized plan was offered to patient via office or my-chart .Patient preferred to pick up at office at next visit or via mychart.   Andrez Grime, LPN   28/11/6627

## 2020-08-10 ENCOUNTER — Other Ambulatory Visit: Payer: Self-pay | Admitting: Family Medicine

## 2020-08-10 DIAGNOSIS — E890 Postprocedural hypothyroidism: Secondary | ICD-10-CM

## 2020-08-10 DIAGNOSIS — R739 Hyperglycemia, unspecified: Secondary | ICD-10-CM

## 2020-08-10 DIAGNOSIS — E785 Hyperlipidemia, unspecified: Secondary | ICD-10-CM

## 2020-08-11 ENCOUNTER — Other Ambulatory Visit (INDEPENDENT_AMBULATORY_CARE_PROVIDER_SITE_OTHER): Payer: Medicare Other

## 2020-08-11 ENCOUNTER — Other Ambulatory Visit: Payer: Self-pay

## 2020-08-11 DIAGNOSIS — E785 Hyperlipidemia, unspecified: Secondary | ICD-10-CM

## 2020-08-11 DIAGNOSIS — E890 Postprocedural hypothyroidism: Secondary | ICD-10-CM | POA: Diagnosis not present

## 2020-08-11 DIAGNOSIS — R739 Hyperglycemia, unspecified: Secondary | ICD-10-CM | POA: Diagnosis not present

## 2020-08-11 LAB — COMPREHENSIVE METABOLIC PANEL
ALT: 31 U/L (ref 0–35)
AST: 21 U/L (ref 0–37)
Albumin: 4.3 g/dL (ref 3.5–5.2)
Alkaline Phosphatase: 104 U/L (ref 39–117)
BUN: 16 mg/dL (ref 6–23)
CO2: 32 mEq/L (ref 19–32)
Calcium: 9.3 mg/dL (ref 8.4–10.5)
Chloride: 103 mEq/L (ref 96–112)
Creatinine, Ser: 0.77 mg/dL (ref 0.40–1.20)
GFR: 80.65 mL/min (ref >60.00)
Glucose, Bld: 123 mg/dL — ABNORMAL HIGH (ref 70–99)
Potassium: 4.8 mEq/L (ref 3.5–5.1)
Sodium: 141 mEq/L (ref 135–145)
Total Bilirubin: 0.7 mg/dL (ref 0.2–1.2)
Total Protein: 6.8 g/dL (ref 6.0–8.3)

## 2020-08-11 LAB — HEMOGLOBIN A1C: Hgb A1c MFr Bld: 6.1 % (ref 4.6–6.5)

## 2020-08-11 LAB — LIPID PANEL
Cholesterol: 201 mg/dL — ABNORMAL HIGH (ref 0–200)
HDL: 41.2 mg/dL (ref >39.00)
LDL Cholesterol: 126 mg/dL — ABNORMAL HIGH (ref 0–99)
NonHDL: 159.86
Total CHOL/HDL Ratio: 5
Triglycerides: 170 mg/dL — ABNORMAL HIGH (ref 0.0–149.0)
VLDL: 34 mg/dL (ref 0.0–40.0)

## 2020-08-11 LAB — TSH: TSH: 3.4 u[IU]/mL (ref 0.35–4.50)

## 2020-08-12 ENCOUNTER — Encounter: Payer: Self-pay | Admitting: Family Medicine

## 2020-08-12 ENCOUNTER — Ambulatory Visit (INDEPENDENT_AMBULATORY_CARE_PROVIDER_SITE_OTHER): Payer: Medicare Other | Admitting: Family Medicine

## 2020-08-12 VITALS — BP 150/86 | HR 80 | Temp 98.2°F | Ht 65.5 in | Wt 242.1 lb

## 2020-08-12 DIAGNOSIS — Z0000 Encounter for general adult medical examination without abnormal findings: Secondary | ICD-10-CM

## 2020-08-12 DIAGNOSIS — E890 Postprocedural hypothyroidism: Secondary | ICD-10-CM | POA: Diagnosis not present

## 2020-08-12 DIAGNOSIS — E785 Hyperlipidemia, unspecified: Secondary | ICD-10-CM | POA: Diagnosis not present

## 2020-08-12 DIAGNOSIS — Z7189 Other specified counseling: Secondary | ICD-10-CM

## 2020-08-12 DIAGNOSIS — R454 Irritability and anger: Secondary | ICD-10-CM | POA: Diagnosis not present

## 2020-08-12 MED ORDER — SIMVASTATIN 20 MG PO TABS: ORAL_TABLET | ORAL | 3 refills | Status: DC

## 2020-08-12 MED ORDER — LEVOTHYROXINE SODIUM 88 MCG PO TABS
88.0000 ug | ORAL_TABLET | Freq: Every day | ORAL | 3 refills | Status: DC
Start: 2020-08-12 — End: 2021-09-01

## 2020-08-12 MED ORDER — CITALOPRAM HYDROBROMIDE 10 MG PO TABS
10.0000 mg | ORAL_TABLET | ORAL | 3 refills | Status: DC
Start: 2020-08-12 — End: 2021-05-14

## 2020-08-12 NOTE — Progress Notes (Signed)
This visit occurred during the SARS-CoV-2 public health emergency.  Safety protocols were in place, including screening questions prior to the visit, additional usage of staff PPE, and extensive cleaning of exam room while observing appropriate contact time as indicated for disinfecting solutions.  Flu 2021 Shingles previously done PNA 2020 Tetanus 2015 covid vaccine 2021 Colon cancer screening with colonoscopy 2017 Breast cancer screening 2019.  Discussed with patient and she'll call about that.   Bone density test 2018. Advance directive-husband designated if patient were incapacitated.  See after visit summary. She can update me if her blood pressure is persistently elevated on home checks.  Elevated Cholesterol: Using medications without problems: yes Muscle aches: no Diet compliance: encouraged.   Exercise: encouraged.  Hypothyroidism.  Compliant.  TSH wnl.  No ADE on med.  No dysphagia.  No neck mass.    Mood d/w pt.  She had tapered citalopram to 10mg  every other day.  She wanted to taper off and then see how she felt.  No SI/HI.   PMH and SH reviewed Meds, vitals, and allergies reviewed.   ROS: Per HPI.  Unless specifically indicated otherwise in HPI, the patient denies:  General: fever. Eyes: acute vision changes ENT: sore throat Cardiovascular: chest pain Respiratory: SOB GI: vomiting GU: dysuria Musculoskeletal: acute back pain Derm: acute rash Neuro: acute motor dysfunction Psych: worsening mood Endocrine: polydipsia Heme: bleeding Allergy: hayfever  GEN: nad, alert and oriented HEENT: ncat NECK: supple w/o LA CV: rrr. PULM: ctab, no inc wob ABD: soft, +bs EXT: no edema SKIN: no acute rash

## 2020-08-12 NOTE — Patient Instructions (Addendum)
Please call about a mammogram.   Check your BP at home.  If persistently >140/>90, then let me know.   Take care.  Glad to see you. Keep working on diet and exercise in the meantime.   Try tapering off citalopram and update me as needed.

## 2020-08-13 DIAGNOSIS — Z0000 Encounter for general adult medical examination without abnormal findings: Secondary | ICD-10-CM | POA: Insufficient documentation

## 2020-08-13 NOTE — Assessment & Plan Note (Signed)
Advance directive- husband designated if patient were incapacitated.  

## 2020-08-13 NOTE — Assessment & Plan Note (Signed)
Compliant.  TSH wnl.  No ADE on med.  No dysphagia.  No neck mass. Continue levothyroxine.

## 2020-08-13 NOTE — Assessment & Plan Note (Signed)
Flu 2021 Shingles previously done PNA 2020 Tetanus 2015 covid vaccine 2021 Colon cancer screening with colonoscopy 2017 Breast cancer screening 2019.  Discussed with patient and she'll call about that.   Bone density test 2018. Advance directive-husband designated if patient were incapacitated.

## 2020-08-13 NOTE — Assessment & Plan Note (Signed)
She had tapered citalopram to 10mg  every other day.  She wanted to taper off and then see how she felt.  No SI/HI. Discussed slow taper. See after visit summary. She will update me as needed.

## 2020-08-13 NOTE — Assessment & Plan Note (Signed)
Continue simvastatin. Continue work on diet and exercise. She agrees.

## 2020-09-01 DIAGNOSIS — L723 Sebaceous cyst: Secondary | ICD-10-CM | POA: Diagnosis not present

## 2020-09-01 DIAGNOSIS — L821 Other seborrheic keratosis: Secondary | ICD-10-CM | POA: Diagnosis not present

## 2020-09-01 DIAGNOSIS — D225 Melanocytic nevi of trunk: Secondary | ICD-10-CM | POA: Diagnosis not present

## 2020-10-16 DIAGNOSIS — Z961 Presence of intraocular lens: Secondary | ICD-10-CM | POA: Diagnosis not present

## 2020-10-16 DIAGNOSIS — H52203 Unspecified astigmatism, bilateral: Secondary | ICD-10-CM | POA: Diagnosis not present

## 2020-10-16 DIAGNOSIS — H524 Presbyopia: Secondary | ICD-10-CM | POA: Diagnosis not present

## 2021-03-11 DIAGNOSIS — Z1231 Encounter for screening mammogram for malignant neoplasm of breast: Secondary | ICD-10-CM | POA: Diagnosis not present

## 2021-03-11 DIAGNOSIS — M8588 Other specified disorders of bone density and structure, other site: Secondary | ICD-10-CM | POA: Diagnosis not present

## 2021-03-11 LAB — HM MAMMOGRAPHY

## 2021-03-13 LAB — HM PAP SMEAR: HM Pap smear: NEGATIVE

## 2021-03-25 LAB — HM DEXA SCAN

## 2021-03-26 ENCOUNTER — Other Ambulatory Visit: Payer: Self-pay

## 2021-03-26 ENCOUNTER — Emergency Department
Admission: EM | Admit: 2021-03-26 | Discharge: 2021-03-26 | Disposition: A | Discharge status: Home / Self Care | Payer: Medicare Other | Attending: Emergency Medicine | Admitting: Emergency Medicine

## 2021-03-26 ENCOUNTER — Emergency Department: Payer: Medicare Other

## 2021-03-26 ENCOUNTER — Encounter: Payer: Self-pay | Admitting: Emergency Medicine

## 2021-03-26 DIAGNOSIS — E039 Hypothyroidism, unspecified: Secondary | ICD-10-CM | POA: Insufficient documentation

## 2021-03-26 DIAGNOSIS — Z87891 Personal history of nicotine dependence: Secondary | ICD-10-CM | POA: Insufficient documentation

## 2021-03-26 DIAGNOSIS — K760 Fatty (change of) liver, not elsewhere classified: Secondary | ICD-10-CM | POA: Diagnosis not present

## 2021-03-26 DIAGNOSIS — R109 Unspecified abdominal pain: Secondary | ICD-10-CM

## 2021-03-26 DIAGNOSIS — K219 Gastro-esophageal reflux disease without esophagitis: Secondary | ICD-10-CM | POA: Diagnosis not present

## 2021-03-26 DIAGNOSIS — N200 Calculus of kidney: Secondary | ICD-10-CM | POA: Diagnosis not present

## 2021-03-26 DIAGNOSIS — Z79899 Other long term (current) drug therapy: Secondary | ICD-10-CM | POA: Diagnosis not present

## 2021-03-26 DIAGNOSIS — K8020 Calculus of gallbladder without cholecystitis without obstruction: Secondary | ICD-10-CM | POA: Diagnosis not present

## 2021-03-26 DIAGNOSIS — K5730 Diverticulosis of large intestine without perforation or abscess without bleeding: Secondary | ICD-10-CM | POA: Diagnosis not present

## 2021-03-26 DIAGNOSIS — N132 Hydronephrosis with renal and ureteral calculous obstruction: Secondary | ICD-10-CM | POA: Diagnosis not present

## 2021-03-26 LAB — URINALYSIS, COMPLETE (UACMP) WITH MICROSCOPIC
Bilirubin Urine: NEGATIVE
Glucose, UA: NEGATIVE mg/dL
Ketones, ur: NEGATIVE mg/dL
Nitrite: NEGATIVE
Protein, ur: 100 mg/dL — AB
Specific Gravity, Urine: 1.018 (ref 1.005–1.030)
WBC, UA: >50 WBC/hpf — ABNORMAL HIGH (ref 0–5)
pH: 6 (ref 5.0–8.0)

## 2021-03-26 LAB — BASIC METABOLIC PANEL
Anion gap: 8 (ref 5–15)
BUN: 13 mg/dL (ref 8–23)
CO2: 30 mmol/L (ref 22–32)
Calcium: 9.5 mg/dL (ref 8.9–10.3)
Chloride: 104 mmol/L (ref 98–111)
Creatinine, Ser: 0.84 mg/dL (ref 0.44–1.00)
GFR, Estimated: >60 mL/min (ref >60)
Glucose, Bld: 152 mg/dL — ABNORMAL HIGH (ref 70–99)
Potassium: 4.2 mmol/L (ref 3.5–5.1)
Sodium: 142 mmol/L (ref 135–145)

## 2021-03-26 LAB — CBC
HCT: 43.6 % (ref 36.0–46.0)
Hemoglobin: 14.5 g/dL (ref 12.0–15.0)
MCH: 31.6 pg (ref 26.0–34.0)
MCHC: 33.3 g/dL (ref 30.0–36.0)
MCV: 95 fL (ref 80.0–100.0)
Platelets: 212 10*3/uL (ref 150–400)
RBC: 4.59 MIL/uL (ref 3.87–5.11)
RDW: 12.8 % (ref 11.5–15.5)
WBC: 8.3 10*3/uL (ref 4.0–10.5)
nRBC: 0 % (ref 0.0–0.2)

## 2021-03-26 MED ORDER — ONDANSETRON 4 MG PO TBDP
4.0000 mg | ORAL_TABLET | Freq: Once | ORAL | Status: AC
  Administered 2021-03-26: 4 mg via ORAL
  Filled 2021-03-26: qty 1

## 2021-03-26 MED ORDER — OXYCODONE-ACETAMINOPHEN 5-325 MG PO TABS: 1.0000 | ORAL_TABLET | Freq: Four times a day (QID) | ORAL | 0 refills | Status: AC | PRN

## 2021-03-26 MED ORDER — TAMSULOSIN HCL 0.4 MG PO CAPS: 0.4000 mg | ORAL_CAPSULE | Freq: Every day | ORAL | 0 refills | Status: AC

## 2021-03-26 MED ORDER — ONDANSETRON 4 MG PO TBDP: 4.0000 mg | ORAL_TABLET | Freq: Three times a day (TID) | ORAL | 0 refills | Status: AC | PRN

## 2021-03-26 MED ORDER — CEPHALEXIN 500 MG PO CAPS: 500.0000 mg | ORAL_CAPSULE | Freq: Four times a day (QID) | ORAL | 0 refills | Status: AC

## 2021-03-26 MED ORDER — OXYCODONE-ACETAMINOPHEN 5-325 MG PO TABS
1.0000 | ORAL_TABLET | Freq: Once | ORAL | Status: AC
  Administered 2021-03-26: 1 via ORAL
  Filled 2021-03-26: qty 1

## 2021-03-26 NOTE — ED Notes (Signed)
See triage note  Presents with left flank pain and dysuria  States pain started about an hr PTA

## 2021-03-26 NOTE — ED Notes (Signed)
Pt became nauseated and had some dry heaving.  Cool washcloth given

## 2021-03-26 NOTE — ED Provider Notes (Signed)
[facility]-EMERGENCY DEPARTMENT  ____________________________________________  Time seen: Approximately 6:59 PM  I have reviewed the triage vital signs and the nursing notes.   HISTORY  Chief Complaint Urinary Frequency and Back Pain   Historian Patient     HPI Sharon Benjamin is a 65 y.o. female presents to the emergency department with increased urinary frequency and left flank pain that started today.  Patient reports that left flank pain seems to radiate to her groin.  No fever or chills at home.  Patient denies dysuria.  No nausea, vomiting or abdominal pain.  Patient has a history of nephrolithiasis and reports that her current symptoms feel similar.   Past Medical History:  Diagnosis Date   Allergy    Arthritis    Cataract    bi;lateral   Diverticulosis of colon    GERD (gastroesophageal reflux disease)    Hyperlipidemia    Insomnia    with prn ambien use, rare as of 2013   Thyroid disease      Immunizations up to date:  Yes.     Past Medical History:  Diagnosis Date   Allergy    Arthritis    Cataract    bi;lateral   Diverticulosis of colon    GERD (gastroesophageal reflux disease)    Hyperlipidemia    Insomnia    with prn ambien use, rare as of 2013   Thyroid disease     Patient Active Problem List   Diagnosis Date Noted   Healthcare maintenance 08/13/2020   Shoulder pain 09/19/2019   Knee pain 07/27/2017   Hypothyroidism following radioiodine therapy 05/09/2016   Advance care planning 01/05/2015   Hyperglycemia 01/05/2015   Medicare welcome exam 11/30/2013   Irritability 11/30/2013   Obesity (BMI 30-39.9) 12/07/2012   Multiple thyroid nodules 08/30/2012   HLD (hyperlipidemia) 07/26/2007   PAP SMEAR, ABNORMAL 07/26/2007    Past Surgical History:  Procedure Laterality Date   Abd sonogram - left renal cyst, GB polyp  03/96   ABDOMINAL HYSTERECTOMY     CATARACT EXTRACTION Bilateral    COLONOSCOPY     CT abd - left renal cyst only  03/96    Left knee arthroscopy  02/2005   TONSILLECTOMY AND ADENOIDECTOMY  Age 26   UGI - hiatal hernia  03/96    Prior to Admission medications   Medication Sig Start Date End Date Taking? Authorizing Provider  cephALEXin (KEFLEX) 500 MG capsule Take 1 capsule (500 mg total) by mouth 4 (four) times daily for 7 days. 03/26/21 04/02/21 Yes Vallarie Mare M, PA-C  ondansetron (ZOFRAN ODT) 4 MG disintegrating tablet Take 1 tablet (4 mg total) by mouth every 8 (eight) hours as needed for up to 5 days. 03/26/21 03/31/21 Yes Vallarie Mare M, PA-C  oxyCODONE-acetaminophen (PERCOCET/ROXICET) 5-325 MG tablet Take 1 tablet by mouth every 6 (six) hours as needed for up to 3 days. 03/26/21 03/29/21 Yes Vallarie Mare M, PA-C  tamsulosin (FLOMAX) 0.4 MG CAPS capsule Take 1 capsule (0.4 mg total) by mouth daily for 7 days. 03/26/21 04/02/21 Yes Vallarie Mare M, PA-C  citalopram (CELEXA) 10 MG tablet Take 1 tablet (10 mg total) by mouth every other day. 08/12/20   Tonia Ghent, MD  fexofenadine (ALLEGRA) 180 MG tablet Take 180 mg by mouth daily as needed.     [provider]  lansoprazole (PREVACID) 15 MG capsule Take 1 capsule (15 mg total) by mouth daily as needed. 08/11/18   Tonia Ghent, MD  levothyroxine (SYNTHROID)  88 MCG tablet Take 1 tablet (88 mcg total) by mouth daily. 08/12/20   Tonia Ghent, MD  naproxen sodium (RA NAPROXEN SODIUM) 220 MG tablet Take by mouth daily as needed.     [provider]  Omega-3 Fatty Acids (FISH OIL) 1000 MG CAPS Take by mouth daily.      [provider]  simvastatin (ZOCOR) 20 MG tablet TAKE 1 TABLET BY MOUTH EVERYDAY AT BEDTIME 08/12/20   Tonia Ghent, MD    Allergies Lipitor [atorvastatin]  Family History  Problem Relation Age of Onset   Hyperlipidemia Mother    Hypertension Mother    Asthma Mother    Arthritis Mother        Psoriatic arthritis   Cancer Father        Esophagus, was a smoker and used ETOH   Alcohol abuse Father     Esophageal cancer Father    Heart disease Maternal Uncle        CHF   Heart disease Maternal Grandmother        MI   Heart disease Other        Angina   Cancer Maternal Aunt        Ovarian   Cancer Paternal Aunt        Breast   Breast cancer Paternal Aunt    Cancer Paternal Uncle        Lung cancer   Diabetes Paternal Grandmother    Cancer Paternal Aunt        Liver cancer   Cancer Paternal Uncle        Lung cancer   Colon cancer Neg Hx    Colon polyps Neg Hx    Rectal cancer Neg Hx    Stomach cancer Neg Hx     Social History Social History   Tobacco Use   Smoking status: Former    Pack years: 0.00   Smokeless tobacco: Never   Tobacco comments:    quit over 30 years ago  Substance Use Topics   Alcohol use: Yes    Alcohol/week: 0.0 standard drinks    Comment: rarely   Drug use: No     Review of Systems  Constitutional: No fever/chills Eyes:  No discharge ENT: No upper respiratory complaints. Respiratory: no cough. No SOB/ use of accessory muscles to breath Gastrointestinal:   No nausea, no vomiting.  No diarrhea.  No constipation. Musculoskeletal: Negative for musculoskeletal pain. Skin: Negative for rash, abrasions, lacerations, ecchymosis.   ____________________________________________   PHYSICAL EXAM:  VITAL SIGNS: ED Triage Vitals  Enc Vitals Group     BP 03/26/21 1807 (!) 198/75     Pulse Rate 03/26/21 1807 88     Resp 03/26/21 1807 18     Temp 03/26/21 1807 98.4 F (36.9 C)     Temp Source 03/26/21 1807 Oral     SpO2 03/26/21 1807 98 %     Weight 03/26/21 1815 243 lb (110.2 kg)     Height 03/26/21 1815 5\' 5"  (1.651 m)     Head Circumference --      Peak Flow --      Pain Score 03/26/21 1815 6     Pain Loc --      Pain Edu? --      Excl. in [place]? --      Constitutional: Alert and oriented. Well appearing and in no acute distress. Eyes: Conjunctivae are normal. PERRL. EOMI. Head: Atraumatic. ENT: Cardiovascular: Normal rate, regular  rhythm. Normal S1 and S2.  Good peripheral circulation. Respiratory: Normal respiratory effort without tachypnea or retractions. Lungs CTAB. Good air entry to the bases with no decreased or absent breath sounds Gastrointestinal: Bowel sounds x 4 quadrants. Soft and nontender to palpation. No guarding or rigidity. No distention. Genitourinary: Patient has left sided CVA tenderness.  Musculoskeletal: Full range of motion to all extremities. No obvious deformities noted Neurologic:  Normal for age. No gross focal neurologic deficits are appreciated.  Skin:  Skin is warm, dry and intact. No rash noted. Psychiatric: Mood and affect are normal for age. Speech and behavior are normal.   ____________________________________________   LABS (all labs ordered are listed, but only abnormal results are displayed)  Labs Reviewed  URINALYSIS, COMPLETE (UACMP) WITH MICROSCOPIC - Abnormal; Notable for the following components:      Result Value   Color, Urine YELLOW (*)    APPearance CLOUDY (*)    Hgb urine dipstick SMALL (*)    Protein, ur 100 (*)    Leukocytes,Ua LARGE (*)    WBC, UA >50 (*)    Bacteria, UA FEW (*)    All other components within normal limits  BASIC METABOLIC PANEL - Abnormal; Notable for the following components:   Glucose, Bld 152 (*)    All other components within normal limits  URINE CULTURE  CBC   ____________________________________________  EKG   ____________________________________________  RADIOLOGY Unk Pinto, personally viewed and evaluated these images (plain radiographs) as part of my medical decision making, as well as reviewing the written report by the radiologist.    CT Renal Stone Study  Result Date: 03/26/2021 CLINICAL DATA:  Back pain, urinary frequency and painful urination, history of urolithiasis EXAM: CT ABDOMEN AND PELVIS WITHOUT CONTRAST TECHNIQUE: Multidetector CT imaging of the abdomen and pelvis was performed following the standard  protocol without IV contrast. COMPARISON:  None. FINDINGS: Lower chest: Lung bases are clear. Normal heart size. No pericardial effusion. Coronary artery atherosclerosis. Hepatobiliary: Hepatomegaly and diffuse hepatic hypoattenuation suggesting hepatic steatosis. Patchy areas of more normal attenuation towards the gallbladder fossa likely reflect geographic focal fatty sparing. 8 mm calcified gallstone towards the neck of the gallbladder. No pericholecystic fluid or inflammation. No biliary ductal dilatation. Pancreas: No pancreatic ductal dilatation or surrounding inflammatory changes. Spleen: Normal in size. No concerning splenic lesions. Adrenals/Urinary Tract: Normal adrenal glands. No visible or contour deforming renal lesions. Multiple bilateral nephroliths are present in the proximal collecting system, largest up to 7 mm in size. There is asymmetric mild left hydroureteronephrosis to the level of a 2 mm calculus positioned at the left ureterovesicular junction (2/84). Urinary bladder is largely decompressed at the time of exam and therefore poorly evaluated by CT imaging. No gross bladder abnormality, calculi or debris. Stomach/Bowel: Small hiatal hernia. Distal stomach and duodenum are unremarkable. No small bowel thickening or dilatation. Fecalization of the distal small bowel contents. Moderate colonic stool burden. No colonic dilatation or wall thickening. Pancolonic diverticulosis of evidence of acute diverticulitis. No evidence of obstruction. Normal appendix in the right lower quadrant. Vascular/Lymphatic: Atherosclerotic calcifications within the abdominal aorta and branch vessels. No aneurysm or ectasia. No enlarged abdominopelvic lymph nodes. Reproductive: Uterus is surgically absent. No concerning adnexal lesions. Other: Solitary loop of bowel projects into a lower abdominal rectus diastasis without obstruction. No bowel containing hernia. No free abdominopelvic air or fluid. Musculoskeletal:  Transitional thoracolumbosacral anatomy. For [employer], lowest rib-bearing level with rudimentary ribs denoted as T12. L5 is a partially  sacralized level. Grade 1 anterolisthesis L4 on L5 favored to be on a facet degenerative basis without spondylolysis. No acute osseous abnormality or suspicious osseous lesion. IMPRESSION: 1. Mild left hydroureteronephrosis to the level of a 2 mm calculus at the left ureterovesicular junction. 2. Additional bilateral nonobstructing proximal renal collecting system calculi. 3. Fecalized small bowel contents and moderate colonic stool burden. Correlate for slowed transit/constipation. 4. Cholelithiasis. 5. Hepatic steatosis and hepatomegaly. 6. Small hiatal hernia. 7. Pancolonic diverticulosis without evidence of acute diverticulitis. 8.  Aortic Atherosclerosis (ICD10-I70.0). Electronically Signed   By: Lovena Le M.D.   On: 03/26/2021 19:30    ____________________________________________    PROCEDURES  Procedure(s) performed:     Procedures     Medications  oxyCODONE-acetaminophen (PERCOCET/ROXICET) 5-325 MG per tablet 1 tablet (1 tablet Oral Given 03/26/21 1850)  ondansetron (ZOFRAN-ODT) disintegrating tablet 4 mg (4 mg Oral Given 03/26/21 1849)  oxyCODONE-acetaminophen (PERCOCET/ROXICET) 5-325 MG per tablet 1 tablet (1 tablet Oral Given 03/26/21 2106)     ____________________________________________   INITIAL IMPRESSION / ASSESSMENT AND PLAN / ED COURSE  Pertinent labs & imaging results that were available during my care of the patient were reviewed by me and considered in my medical decision making (see chart for details).      Assessment and Plan:  Left flank pain:  65 year old female presents to the emergency department with left flank pain, nausea and vomiting that started today.  Patient was hypertensive at triage but vital signs were otherwise reassuring.  Urinalysis was concerning with a large amount of leuks, few bacteria  and a small amount of blood.  CT renal stone study showed a 2 mm stone at the left UVJ concerning for nephrolithiasis.  I did consult urology given my concern for signs of infection on urinalysis.  He recommended outpatient antibiotics and urology follow-up as needed.  Patient was discharged with Percocet, Flomax, Zofran and Keflex.  Patient was cautioned to return to the emergency department with fever or worsening nausea/vomiting.  She was also advised to start stool softener in order to avoid constipation.  All patient questions were answered.  ____________________________________________  FINAL CLINICAL IMPRESSION(S) / ED DIAGNOSES  Final diagnoses:  Flank pain      NEW MEDICATIONS STARTED DURING THIS VISIT:  ED Discharge Orders          Ordered    tamsulosin (FLOMAX) 0.4 MG CAPS capsule  Daily        03/26/21 2043    oxyCODONE-acetaminophen (PERCOCET/ROXICET) 5-325 MG tablet  Every 6 hours PRN        03/26/21 2044    ondansetron (ZOFRAN ODT) 4 MG disintegrating tablet  Every 8 hours PRN        03/26/21 2044    cephALEXin (KEFLEX) 500 MG capsule  4 times daily        03/26/21 2044                This chart was dictated using voice recognition software/Dragon. Despite best efforts to proofread, errors can occur which can change the meaning. Any change was purely unintentional.     Lannie Fields, PA-C 03/26/21 2143    Duffy Bruce, MD 03/29/21 [phone]

## 2021-03-26 NOTE — ED Triage Notes (Signed)
Pt reports that she developed back pain and is having urinary frequency and painful urination. She has had a kidney stone 30 years ago and the pain reminds her of that pain.

## 2021-03-26 NOTE — Discharge Instructions (Addendum)
You can take Percocet for pain. Take Percocet was Zofran to avoid nausea. You can take MiraLAX once daily for constipation. Take Flomax once daily. Start Keflex 4 times a day for the next 7 days.

## 2021-03-31 ENCOUNTER — Telehealth: Payer: Self-pay | Admitting: *Deleted

## 2021-03-31 NOTE — Telephone Encounter (Signed)
Appt made

## 2021-03-31 NOTE — Telephone Encounter (Signed)
-----   Message from Abbie Sons, MD sent at 03/30/2021  4:27 PM EDT ----- Regarding: ED visit Seen in ED 6/30 with stone.  Please schedule follow-up visit with KUB prior

## 2021-04-02 ENCOUNTER — Ambulatory Visit
Admission: RE | Admit: 2021-04-02 | Discharge: 2021-04-02 | Disposition: A | Discharge status: Home / Self Care | Payer: Medicare Other | Source: Ambulatory Visit | Attending: Urology | Admitting: Urology

## 2021-04-02 ENCOUNTER — Ambulatory Visit
Admission: RE | Admit: 2021-04-02 | Discharge: 2021-04-02 | Disposition: A | Discharge status: Home / Self Care | Payer: Medicare Other | Attending: Urology | Admitting: Urology

## 2021-04-02 ENCOUNTER — Encounter: Payer: Self-pay | Admitting: Urology

## 2021-04-02 ENCOUNTER — Ambulatory Visit: Payer: Medicare Other | Admitting: Urology

## 2021-04-02 ENCOUNTER — Other Ambulatory Visit: Payer: Self-pay

## 2021-04-02 VITALS — BP 177/96 | HR 91 | Ht 65.0 in | Wt 243.0 lb

## 2021-04-02 DIAGNOSIS — R109 Unspecified abdominal pain: Secondary | ICD-10-CM | POA: Diagnosis not present

## 2021-04-02 DIAGNOSIS — N201 Calculus of ureter: Secondary | ICD-10-CM | POA: Diagnosis not present

## 2021-04-02 DIAGNOSIS — N200 Calculus of kidney: Secondary | ICD-10-CM | POA: Insufficient documentation

## 2021-04-02 DIAGNOSIS — R8281 Pyuria: Secondary | ICD-10-CM

## 2021-04-02 NOTE — Progress Notes (Signed)
04/02/2021 1:10 PM   Sharon Benjamin 07-Feb-1956 [id number]  Referring provider: Tonia Ghent, MD [address],  [place] [address]  Chief Complaint  Patient presents with   Nephrolithiasis    HPI: Sharon Benjamin is a 65 y.o. female who presents for follow-up of recent ED visit.  [facility] ED visit 03/26/2021 with complaints of urinary frequency, dysuria and back pain Pain located left flank region with some radiation to the groin Denied fever, chills, nausea or vomiting Prior history stone disease with similar symptoms Stone protocol CT with a 2 mm left UVJ calculus and mild hydronephrosis Bilateral nonobstructing renal calculi were also present Urinalysis did show pyuria however had 6-10 squamous epithelial cells. She was started on antibiotics however urine culture was not ordered Since her ED visit she states her pain resolved later that evening and she has been asymptomatic since that time No bothersome LUTS, she finishes her antibiotic today   PMH: Past Medical History:  Diagnosis Date   Allergy    Arthritis    Cataract    bi;lateral   Diverticulosis of colon    GERD (gastroesophageal reflux disease)    Hyperlipidemia    Insomnia    with prn ambien use, rare as of 2013   Thyroid disease     Surgical History: Past Surgical History:  Procedure Laterality Date   Abd sonogram - left renal cyst, GB polyp  03/96   ABDOMINAL HYSTERECTOMY     CATARACT EXTRACTION Bilateral    COLONOSCOPY     CT abd - left renal cyst only  03/96   Left knee arthroscopy  02/2005   TONSILLECTOMY AND ADENOIDECTOMY  Age 8   UGI - hiatal hernia  03/96    Home Medications:  Allergies as of 04/02/2021       Reactions   Lipitor [atorvastatin]    Aches.  Did tolerate simvastatin.          Medication List        Accurate as of April 02, 2021  1:10 PM. If you have any questions, ask your nurse or doctor.          cephALEXin 500 MG capsule Commonly known as:  KEFLEX Take 1 capsule (500 mg total) by mouth 4 (four) times daily for 7 days.   citalopram 10 MG tablet Commonly known as: CELEXA Take 1 tablet (10 mg total) by mouth every other day.   fexofenadine 180 MG tablet Commonly known as: ALLEGRA Take 180 mg by mouth daily as needed.   Fish Oil 1000 MG Caps Take by mouth daily.   lansoprazole 15 MG capsule Commonly known as: Prevacid Take 1 capsule (15 mg total) by mouth daily as needed.   levothyroxine 88 MCG tablet Commonly known as: Synthroid Take 1 tablet (88 mcg total) by mouth daily.   naproxen sodium 220 MG tablet Commonly known as: ALEVE Take by mouth daily as needed.   simvastatin 20 MG tablet Commonly known as: ZOCOR TAKE 1 TABLET BY MOUTH EVERYDAY AT BEDTIME   tamsulosin 0.4 MG Caps capsule Commonly known as: Flomax Take 1 capsule (0.4 mg total) by mouth daily for 7 days.        Allergies:  Allergies  Allergen Reactions   Lipitor [Atorvastatin]     Aches.  Did tolerate simvastatin.      Family History: Family History  Problem Relation Age of Onset   Hyperlipidemia Mother    Hypertension Mother    Asthma Mother  Arthritis Mother        Psoriatic arthritis   Cancer Father        Esophagus, was a smoker and used ETOH   Alcohol abuse Father    Esophageal cancer Father    Heart disease Maternal Uncle        CHF   Heart disease Maternal Grandmother        MI   Heart disease Other        Angina   Cancer Maternal Aunt        Ovarian   Cancer Paternal Aunt        Breast   Breast cancer Paternal Aunt    Cancer Paternal Uncle        Lung cancer   Diabetes Paternal Grandmother    Cancer Paternal Aunt        Liver cancer   Cancer Paternal Uncle        Lung cancer   Colon cancer Neg Hx    Colon polyps Neg Hx    Rectal cancer Neg Hx    Stomach cancer Neg Hx     Social History:  reports that she has quit smoking. She has never used smokeless tobacco. She reports current alcohol use. She reports  that she does not use drugs.   Physical Exam: BP (!) 177/96   Pulse 91   Ht 5\' 5"  (1.651 m)   Wt 243 lb (110.2 kg)   BMI 40.44 kg/m   Constitutional:  Alert and oriented, No acute distress. HEENT: [place] AT, moist mucus membranes.  Trachea midline, no masses. Cardiovascular: No clubbing, cyanosis, or edema. Respiratory: Normal respiratory effort, no increased work of breathing. Neurologic: Grossly intact, no focal deficits, moving all 4 extremities. Psychiatric: Normal mood and affect.  Laboratory Data:  Urinalysis Dipstick 1+ blood/1+ leukocytes Microscopy 11-30 WBC  Pertinent Imaging: CT images were reviewed and personally interpreted   CT Renal Stone Study  Narrative CLINICAL DATA:  Back pain, urinary frequency and painful urination, history of urolithiasis  EXAM: CT ABDOMEN AND PELVIS WITHOUT CONTRAST  TECHNIQUE: Multidetector CT imaging of the abdomen and pelvis was performed following the standard protocol without IV contrast.  COMPARISON:  None.  FINDINGS: Lower chest: Lung bases are clear. Normal heart size. No pericardial effusion. Coronary artery atherosclerosis.  Hepatobiliary: Hepatomegaly and diffuse hepatic hypoattenuation suggesting hepatic steatosis. Patchy areas of more normal attenuation towards the gallbladder fossa likely reflect geographic focal fatty sparing. 8 mm calcified gallstone towards the neck of the gallbladder. No pericholecystic fluid or inflammation. No biliary ductal dilatation.  Pancreas: No pancreatic ductal dilatation or surrounding inflammatory changes.  Spleen: Normal in size. No concerning splenic lesions.  Adrenals/Urinary Tract: Normal adrenal glands. No visible or contour deforming renal lesions. Multiple bilateral nephroliths are present in the proximal collecting system, largest up to 7 mm in size. There is asymmetric mild left hydroureteronephrosis to the level of a 2 mm calculus positioned at the left  ureterovesicular junction (2/84). Urinary bladder is largely decompressed at the time of exam and therefore poorly evaluated by CT imaging. No gross bladder abnormality, calculi or debris.  Stomach/Bowel: Small hiatal hernia. Distal stomach and duodenum are unremarkable. No small bowel thickening or dilatation. Fecalization of the distal small bowel contents. Moderate colonic stool burden. No colonic dilatation or wall thickening. Pancolonic diverticulosis of evidence of acute diverticulitis. No evidence of obstruction. Normal appendix in the right lower quadrant.  Vascular/Lymphatic: Atherosclerotic calcifications within the abdominal aorta and branch vessels. No aneurysm  or ectasia. No enlarged abdominopelvic lymph nodes.  Reproductive: Uterus is surgically absent. No concerning adnexal lesions.  Other: Solitary loop of bowel projects into a lower abdominal rectus diastasis without obstruction. No bowel containing hernia. No free abdominopelvic air or fluid.  Musculoskeletal: Transitional thoracolumbosacral anatomy. For [employer], lowest rib-bearing level with rudimentary ribs denoted as T12. L5 is a partially sacralized level. Grade 1 anterolisthesis L4 on L5 favored to be on a facet degenerative basis without spondylolysis. No acute osseous abnormality or suspicious osseous lesion.  IMPRESSION: 1. Mild left hydroureteronephrosis to the level of a 2 mm calculus at the left ureterovesicular junction. 2. Additional bilateral nonobstructing proximal renal collecting system calculi. 3. Fecalized small bowel contents and moderate colonic stool burden. Correlate for slowed transit/constipation. 4. Cholelithiasis. 5. Hepatic steatosis and hepatomegaly. 6. Small hiatal hernia. 7. Pancolonic diverticulosis without evidence of acute diverticulitis. 8.  Aortic Atherosclerosis (ICD10-I70.0).   Electronically Signed By: Lovena Le M.D. On: 03/26/2021  19:30   Assessment & Plan:    1.  Left ureteral calculus She has most likely passed the small calculus KUB ordered today  2.  Bilateral nephrolithiasis Recommended a metabolic evaluation through [place] today as above Options were discussed including observation with periodic KUB versus ureteroscopy SWL was discussed but would take several treatments  3.  Pyuria Asymptomatic Urine culture ordered   Abbie Sons, [place] [address] [place], [place] [address] [phone]

## 2021-04-02 NOTE — Patient Instructions (Signed)

## 2021-04-03 LAB — URINALYSIS, COMPLETE
Bilirubin, UA: NEGATIVE
Glucose, UA: NEGATIVE
Nitrite, UA: NEGATIVE
Protein,UA: NEGATIVE
Specific Gravity, UA: 1.025 (ref 1.005–1.030)
Urobilinogen, Ur: 0.2 mg/dL (ref 0.2–1.0)
pH, UA: 5 (ref 5.0–7.5)

## 2021-04-03 LAB — MICROSCOPIC EXAMINATION: Bacteria, UA: NONE SEEN

## 2021-04-05 LAB — CULTURE, URINE COMPREHENSIVE

## 2021-04-06 ENCOUNTER — Telehealth: Payer: Self-pay | Admitting: *Deleted

## 2021-04-06 ENCOUNTER — Other Ambulatory Visit: Payer: Self-pay | Admitting: *Deleted

## 2021-04-06 MED ORDER — CIPROFLOXACIN HCL 250 MG PO TABS: 250.0000 mg | ORAL_TABLET | Freq: Two times a day (BID) | ORAL | 0 refills | Status: AC

## 2021-04-06 NOTE — Telephone Encounter (Signed)
-----   Message from Abbie Sons, MD sent at 04/04/2021  8:06 PM EDT ----- The left ureteral calculus is not seen on KUB and most likely has passed.  Bilateral renal calculi are noted.  Proceed with metabolic evaluation as discussed at last week's office visit

## 2021-04-06 NOTE — Telephone Encounter (Signed)
Notified patient as instructed, patient pleased. Discussed follow-up appointments, patient agrees  

## 2021-04-06 NOTE — Telephone Encounter (Signed)
-----   Message from Abbie Sons, MD sent at 04/06/2021  7:12 AM EDT ----- Urine culture did grow a low level of bacteria.  Please send in Rx Cipro 250 mg twice daily x5 days.

## 2021-04-16 ENCOUNTER — Other Ambulatory Visit: Payer: Self-pay

## 2021-04-16 ENCOUNTER — Other Ambulatory Visit: Payer: Medicare Other

## 2021-04-16 DIAGNOSIS — N200 Calculus of kidney: Secondary | ICD-10-CM | POA: Diagnosis not present

## 2021-05-11 ENCOUNTER — Telehealth: Payer: Self-pay | Admitting: *Deleted

## 2021-05-11 NOTE — Telephone Encounter (Signed)
Noted. Thanks.  Will await update from patient.

## 2021-05-11 NOTE — Telephone Encounter (Signed)
Patient called stating that her husband tested positive for covid yesterday. Patient stated that she started yesterday with a headache and fever. Patient stated that she tested negative but feels that she has covid too. Patient stated that she is on the way to take her husband to [facility]. Patient stated that she will test herself for covid when she gets back home. Patient was given ER precautions and she verbalized understanding. Patient stated that she will call back with covid test results later.

## 2021-05-12 DIAGNOSIS — Z20822 Contact with and (suspected) exposure to covid-19: Secondary | ICD-10-CM | POA: Diagnosis not present

## 2021-05-14 ENCOUNTER — Telehealth: Payer: Medicare Other | Admitting: Physician Assistant

## 2021-05-14 ENCOUNTER — Telehealth: Payer: Self-pay

## 2021-05-14 DIAGNOSIS — U071 COVID-19: Secondary | ICD-10-CM

## 2021-05-14 MED ORDER — MOLNUPIRAVIR EUA 200MG CAPSULE: 4.0000 | ORAL_CAPSULE | Freq: Two times a day (BID) | ORAL | 0 refills | Status: AC

## 2021-05-14 MED ORDER — BENZONATATE 100 MG PO CAPS: 100.0000 mg | ORAL_CAPSULE | Freq: Three times a day (TID) | ORAL | 0 refills | Status: DC | PRN

## 2021-05-14 NOTE — Telephone Encounter (Signed)
Dr. Damita Dunnings is aware of this message and was discussed patient can do VV with him today at 4:30pm. Patient has already done a VV with Raiford Noble, PA this morning. Patient was given benzonatate and molnupiravir.

## 2021-05-14 NOTE — Progress Notes (Signed)
Virtual Visit Consent   Sharon Benjamin, you are scheduled for a virtual visit with a [facility] provider today.     Just as with appointments in the office, your consent must be obtained to participate.  Your consent will be active for this visit and any virtual visit you may have with one of our providers in the next 365 days.     If you have a MyChart account, a copy of this consent can be sent to you electronically.  All virtual visits are billed to your insurance company just like a traditional visit in the office.    As this is a virtual visit, video technology does not allow for your provider to perform a traditional examination.  This may limit your provider's ability to fully assess your condition.  If your provider identifies any concerns that need to be evaluated in person or the need to arrange testing (such as labs, EKG, etc.), we will make arrangements to do so.     Although advances in technology are sophisticated, we cannot ensure that it will always work on either your end or our end.  If the connection with a video visit is poor, the visit may have to be switched to a telephone visit.  With either a video or telephone visit, we are not always able to ensure that we have a secure connection.     I need to obtain your verbal consent now.   Are you willing to proceed with your visit today?    Sharon Benjamin has provided verbal consent on 05/14/2021 for a virtual visit (video or telephone).   Sharon Benjamin, [place]   Date: 05/14/2021 10:11 AM   Virtual Visit via Video Note   I, Sharon Benjamin, connected with  Sharon Benjamin  ([id number], September 08, 1956) on 05/14/21 at 10:00 AM EDT by a video-enabled telemedicine application and verified that I am speaking with the correct person using two identifiers.  Location: Patient: Virtual Visit Location Patient: Home Provider: Virtual Visit Location Provider: Home Office   I discussed the limitations of evaluation and  management by telemedicine and the availability of in person appointments. The patient expressed understanding and agreed to proceed.    History of Present Illness: Sharon Benjamin is a 65 y.o. who identifies as a female who was assigned female at birth, and is being seen today for COVID-19. Symptoms starting Sunday afternoon into night with dry, persistent cough. Has also noted fever (Tmax 101), congestion, headache and scratchy throat. Denies chest pain or SOB. Has noted mild loose stool. Denies melena or hematochezia. Has been taking Tylenol for fever and sore throat. Husband with COVID and MAC pneumonia. She is his primary care provider.   HPI: HPI  Problems:  Patient Active Problem List   Diagnosis Date Noted   Healthcare maintenance 08/13/2020   Shoulder pain 09/19/2019   Knee pain 07/27/2017   Hypothyroidism following radioiodine therapy 05/09/2016   Advance care planning 01/05/2015   Hyperglycemia 01/05/2015   Medicare welcome exam 11/30/2013   Irritability 11/30/2013   Obesity (BMI 30-39.9) 12/07/2012   Multiple thyroid nodules 08/30/2012   HLD (hyperlipidemia) 07/26/2007   PAP SMEAR, ABNORMAL 07/26/2007    Allergies:  Allergies  Allergen Reactions   Lipitor [Atorvastatin]     Aches.  Did tolerate simvastatin.     Medications:  Current Outpatient Medications:    benzonatate (TESSALON) 100 MG capsule, Take 1 capsule (100 mg total) by mouth 3 (three) times daily  as needed for cough., Disp: 30 capsule, Rfl: 0   molnupiravir EUA 200 mg CAPS, Take 4 capsules (800 mg total) by mouth 2 (two) times daily for 5 days., Disp: 40 capsule, Rfl: 0   fexofenadine (ALLEGRA) 180 MG tablet, Take 180 mg by mouth daily as needed. , Disp: , Rfl:    lansoprazole (PREVACID) 15 MG capsule, Take 1 capsule (15 mg total) by mouth daily as needed., Disp: , Rfl:    levothyroxine (SYNTHROID) 88 MCG tablet, Take 1 tablet (88 mcg total) by mouth daily., Disp: 90 tablet, Rfl: 3   naproxen sodium (ALEVE)  220 MG tablet, Take by mouth daily as needed. , Disp: , Rfl:    simvastatin (ZOCOR) 20 MG tablet, TAKE 1 TABLET BY MOUTH EVERYDAY AT BEDTIME, Disp: 90 tablet, Rfl: 3  Observations/Objective: Patient is well-developed, well-nourished in no acute distress.  Resting comfortably at home.  Head is normocephalic, atraumatic.  No labored breathing. Speech is clear and coherent with logical content.  Patient is alert and oriented at baseline.   Assessment and Plan: 1. COVID-19 - benzonatate (TESSALON) 100 MG capsule; Take 1 capsule (100 mg total) by mouth 3 (three) times daily as needed for cough.  Dispense: 30 capsule; Refill: 0 - molnupiravir EUA 200 mg CAPS; Take 4 capsules (800 mg total) by mouth 2 (two) times daily for 5 days.  Dispense: 40 capsule; Refill: 0 - MyChart COVID-19 home monitoring program; Future Patient with isk factors for complicated course of illness. Also primary care provider for her husband who is getting over severe COVID and MAC pneumonia. Discussed risks/benefits of antiviral medications including most common potential ADRs. Patient voiced understanding and would like to proceed with antiviral medication. They are candidate for molnupiravir. Rx sent to pharmacy. Supportive measures, OTC medications and vitamin regimen reviewed. Tessalon per orders. Patient has been enrolled in a MyChart COVID symptom monitoring program. Samule Dry reviewed in detail. Strict ER precautions discussed with patient.    Follow Up Instructions: I discussed the assessment and treatment plan with the patient. The patient was provided an opportunity to ask questions and all were answered. The patient agreed with the plan and demonstrated an understanding of the instructions.  A copy of instructions were sent to the patient via MyChart.  The patient was advised to call back or seek an in-person evaluation if the symptoms worsen or if the condition fails to improve as anticipated.  Time:  I spent 14  minutes with the patient via telehealth technology discussing the above problems/concerns.    Sharon Rio, PA-C

## 2021-05-14 NOTE — Telephone Encounter (Signed)
Patient started having covid symptoms on 8/14 tested at [facility] on 8/16 and results received yesterday positive for covid. Patient husband was released from [facility] and is very sick not sure if he will improve. We do not have anything open at any office. Patient if very anxious about being treated due to being husband's primary care taker. Patient became very upset when I offered information for [facility] for virtual. She states that she does not want anything to do with [facility] right now that is where she got this mess. Do you have any spots available to work in?

## 2021-05-14 NOTE — Patient Instructions (Signed)
Victorino Sparrow, thank you for joining [name], PA-C for today's virtual visit.  While this provider is not your primary care provider (PCP), if your PCP is located in our provider database this encounter information will be shared with them immediately following your visit.  Consent: (Patient) Sharon Benjamin provided verbal consent for this virtual visit at the beginning of the encounter.  Current Medications:  Current Outpatient Medications:    benzonatate (TESSALON) 100 MG capsule, Take 1 capsule (100 mg total) by mouth 3 (three) times daily as needed for cough., Disp: 30 capsule, Rfl: 0   molnupiravir EUA 200 mg CAPS, Take 4 capsules (800 mg total) by mouth 2 (two) times daily for 5 days., Disp: 40 capsule, Rfl: 0   fexofenadine (ALLEGRA) 180 MG tablet, Take 180 mg by mouth daily as needed. , Disp: , Rfl:    lansoprazole (PREVACID) 15 MG capsule, Take 1 capsule (15 mg total) by mouth daily as needed., Disp: , Rfl:    levothyroxine (SYNTHROID) 88 MCG tablet, Take 1 tablet (88 mcg total) by mouth daily., Disp: 90 tablet, Rfl: 3   naproxen sodium (ALEVE) 220 MG tablet, Take by mouth daily as needed. , Disp: , Rfl:    simvastatin (ZOCOR) 20 MG tablet, TAKE 1 TABLET BY MOUTH EVERYDAY AT BEDTIME, Disp: 90 tablet, Rfl: 3   Medications ordered in this encounter:  Meds ordered this encounter  Medications   benzonatate (TESSALON) 100 MG capsule    Sig: Take 1 capsule (100 mg total) by mouth 3 (three) times daily as needed for cough.    Dispense:  30 capsule    Refill:  0    Order Specific Question:   Supervising Provider    Answer:   Sabra Heck, BRIAN [[id number]]   molnupiravir EUA 200 mg CAPS    Sig: Take 4 capsules (800 mg total) by mouth 2 (two) times daily for 5 days.    Dispense:  40 capsule    Refill:  0    Order Specific Question:   Supervising Provider    Answer:   Sabra Heck, Colbert     *If you need refills on other medications prior to your next appointment, please  contact your pharmacy*  Follow-Up: Call back or seek an in-person evaluation if the symptoms worsen or if the condition fails to improve as anticipated.  Other Instructions Please keep well-hydrated and get plenty of rest. Start a saline nasal rinse to flush out your nasal passages. You can use plain Mucinex to help thin congestion. If you have a humidifier, running in the bedroom at night. I want you to start OTC vitamin D3 1000 units daily, vitamin C 1000 mg daily, and a zinc supplement. Please take prescribed medications as directed.  You have been enrolled in a MyChart symptom monitoring program. Please answer these questions daily so we can keep track of how you are doing.  You were to quarantine for 5 days from onset of your symptoms.  After day 5, if you have had no fever and you are feeling better, you can end quarantine but need to mask for an additional 5 days. After day 5 if you have a fever or are having significant symptoms, please quarantine for full 10 days.  If you note any worsening of symptoms, any significant shortness of breath or any chest pain, please seek ER evaluation ASAP.  Please do not delay care!    If you have been instructed to have an in-person evaluation  today at a local Urgent Care facility, please use the link below. It will take you to a list of all of our available [facility], including address, phone number and hours of operation. Please do not delay care.  [facility]  If you or a family member do not have a primary care provider, use the link below to schedule a visit and establish care. When you choose a [facility] primary care physician or advanced practice provider, you gain a long-term partner in health. Find a Primary Care Provider  Learn more about [facility]'s in-office and virtual care options: [facility]

## 2021-05-15 NOTE — Telephone Encounter (Signed)
Late entry.  Called and checked on patient yesterday afternoon.  She said she was really frustrated about her situation but it wasn't directed at anyone here in the clinic.  I told her my concern was about her getting care and I was glad she already had a visit re: covid.  I asked her to update me as needed and she agreed and thanked me for the call.

## 2021-08-03 NOTE — Progress Notes (Signed)
Subjective:   Sharon Benjamin is a 65 y.o. female who presents for Medicare Annual (Subsequent) preventive examination.  I connected with Roosvelt Maser today by telephone and verified that I am speaking with the correct person using two identifiers. Location patient: home Location provider: work Persons participating in the virtual visit: patient, Marine scientist.    I discussed the limitations, risks, security and privacy concerns of performing an evaluation and management service by telephone and the availability of in person appointments. I also discussed with the patient that there may be a patient responsible charge related to this service. The patient expressed understanding and verbally consented to this telephonic visit.    Interactive audio and video telecommunications were attempted between this provider and patient, however failed, due to patient having technical difficulties OR patient did not have access to video capability.  We continued and completed visit with audio only.  Some vital signs may be absent or patient reported.   Time Spent with patient on telephone encounter: 25 minutes   Review of Systems     Cardiac Risk Factors include: advanced age (>12men, >64 women);dyslipidemia     Objective:    Today's Vitals   08/07/21 1025  Weight: 243 lb (110.2 kg)  Height: 5\' 5"  (1.651 m)   Body mass index is 40.44 kg/m.  Advanced Directives 08/07/2021 03/26/2021 08/05/2020 02/03/2016 01/13/2016  Does Patient Have a Medical Advance Directive? No No No No No  Would patient like information on creating a medical advance directive? Yes (MAU/Ambulatory/Procedural Areas - Information given) - No - Patient declined - No - patient declined information    Current Medications (verified) Outpatient Encounter Medications as of 08/07/2021  Medication Sig   benzonatate (TESSALON) 100 MG capsule Take 1 capsule (100 mg total) by mouth 3 (three) times daily as needed for cough.   fexofenadine  (ALLEGRA) 180 MG tablet Take 180 mg by mouth daily as needed.    lansoprazole (PREVACID) 15 MG capsule Take 1 capsule (15 mg total) by mouth daily as needed.   levothyroxine (SYNTHROID) 88 MCG tablet Take 1 tablet (88 mcg total) by mouth daily.   meloxicam (MOBIC) 15 MG tablet Take 1 tablet (15 mg total) by mouth daily.   methylPREDNISolone (MEDROL DOSEPAK) 4 MG TBPK tablet 6 day dose pack - take as directed   naproxen sodium (ALEVE) 220 MG tablet Take by mouth daily as needed.    simvastatin (ZOCOR) 20 MG tablet TAKE 1 TABLET BY MOUTH EVERYDAY AT BEDTIME   No facility-administered encounter medications on file as of 08/07/2021.    Allergies (verified) Lipitor [atorvastatin]   History: Past Medical History:  Diagnosis Date   Allergy    Arthritis    Cataract    bi;lateral   Diverticulosis of colon    GERD (gastroesophageal reflux disease)    Hyperlipidemia    Insomnia    with prn ambien use, rare as of 2013   Thyroid disease    Past Surgical History:  Procedure Laterality Date   Abd sonogram - left renal cyst, GB polyp  03/96   ABDOMINAL HYSTERECTOMY     CATARACT EXTRACTION Bilateral    COLONOSCOPY     CT abd - left renal cyst only  03/96   Left knee arthroscopy  02/2005   TONSILLECTOMY AND ADENOIDECTOMY  Age 75   UGI - hiatal hernia  03/96   Family History  Problem Relation Age of Onset   Hyperlipidemia Mother    Hypertension Mother  Asthma Mother    Arthritis Mother        Psoriatic arthritis   Cancer Father        Esophagus, was a smoker and used ETOH   Alcohol abuse Father    Esophageal cancer Father    Heart disease Maternal Uncle        CHF   Heart disease Maternal Grandmother        MI   Heart disease Other        Angina   Cancer Maternal Aunt        Ovarian   Cancer Paternal Aunt        Breast   Breast cancer Paternal Aunt    Cancer Paternal Uncle        Lung cancer   Diabetes Paternal Grandmother    Cancer Paternal Aunt        Liver cancer    Cancer Paternal Uncle        Lung cancer   Colon cancer Neg Hx    Colon polyps Neg Hx    Rectal cancer Neg Hx    Stomach cancer Neg Hx    Social History   Socioeconomic History   Marital status: Married    Spouse name: Not on file   Number of children: 0   Years of education: Not on file   Highest education level: Not on file  Occupational History   Occupation: Aeronautical engineer: [employer]  Tobacco Use   Smoking status: Former   Smokeless tobacco: Never   Tobacco comments:    quit over 30 years ago  Substance and Sexual Activity   Alcohol use: Yes    Alcohol/week: 0.0 standard drinks    Comment: rarely   Drug use: No   Sexual activity: Not on file  Other Topics Concern   Not on file  Social History Narrative   Retired as Optometrist for [employer]   Married 1983   Social Determinants of Health   Financial Resource Strain: Low Risk    Difficulty of Paying Living Expenses: Not hard at all  Food Insecurity: No Food Insecurity   Worried About Charity fundraiser in the Last Year: Never true   Arboriculturist in the Last Year: Never true  Transportation Needs: No Transportation Needs   Lack of Transportation (Medical): No   Lack of Transportation (Non-Medical): No  Physical Activity: Inactive   Days of Exercise per Week: 0 days   Minutes of Exercise per Session: 0 min  Stress: Stress Concern Present   Feeling of Stress : Rather much  Social Connections: Moderately Integrated   Frequency of Communication with Friends and Family: Three times a week   Frequency of Social Gatherings with Friends and Family: Never   Attends Religious Services: More than 4 times per year   Active Member of Clubs or Organizations: No   Attends Music therapist: Never   Marital Status: Married    Tobacco Counseling Counseling given: Not Answered Tobacco comments: quit over 30 years ago   Clinical Intake:  Pre-visit preparation completed: Yes  Pain :  No/denies pain     BMI - recorded: 40.44 Nutritional Status: BMI > 30  Obese Nutritional Risks: None Diabetes: No  How often do you need to have someone help you when you read instructions, pamphlets, or other written materials from your doctor or pharmacy?: 1 - Never  Diabetic?No  Interpreter Needed?: No  Information entered by ::  Orrin Brigham LPN   Activities of Daily Living In your present state of health, do you have any difficulty performing the following activities: 08/07/2021  Hearing? Y  Comment wears hearing aids in both ears  Vision? N  Difficulty concentrating or making decisions? N  Walking or climbing stairs? N  Dressing or bathing? N  Doing errands, shopping? N  Preparing Food and eating ? N  Using the Toilet? N  In the past six months, have you accidently leaked urine? N  Do you have problems with loss of bowel control? N  Managing your Medications? N  Managing your Finances? N  Housekeeping or managing your Housekeeping? N  Some recent data might be hidden    Patient Care Team: Tonia Ghent, MD as PCP - General (Family Medicine)  Indicate any recent Medical Services you may have received from other than [facility] providers in the past year (date may be approximate).     Assessment:   This is a routine wellness examination for Sharon Benjamin.  Hearing/Vision screen Hearing Screening - Comments:: Wears hearing aids in both ears Vision Screening - Comments:: Last exam 09/2020, Dr. Gershon Crane  Dietary issues and exercise activities discussed: Current Exercise Habits: The patient does not participate in regular exercise at present   Goals Addressed             This Visit's Progress    Patient Stated       Would like to maintain being more active.       Depression Screen PHQ 2/9 Scores 08/12/2020 08/05/2020 09/17/2019 08/11/2018 07/25/2017  PHQ - 2 Score 0 0 0 0 0  PHQ- 9 Score 0 0 - - -    Fall Risk Fall Risk  08/07/2021 08/12/2020 08/05/2020  09/17/2019 07/25/2017  Falls in the past year? 0 0 0 0 No  Number falls in past yr: 0 0 0 - -  Injury with Fall? 0 0 0 - -  Risk for fall due to : No Fall Risks - No Fall Risks - -  Follow up Falls prevention discussed Falls evaluation completed Falls evaluation completed;Falls prevention discussed - -    FALL RISK PREVENTION PERTAINING TO THE HOME:  Any stairs in or around the home? Yes  If so, are there any without handrails? No  Home free of loose throw rugs in walkways, pet beds, electrical cords, etc? Yes  Adequate lighting in your home to reduce risk of falls? Yes   ASSISTIVE DEVICES UTILIZED TO PREVENT FALLS:  Life alert? No  Use of a cane, walker or w/c? No  Grab bars in the bathroom? Yes  Shower chair or bench in shower? No  Elevated toilet seat or a handicapped toilet? No   TIMED UP AND GO:  Was the test performed? No , visit completed over the phone   Cognitive Function: Normal cognitive status assessed by this Nurse Health Advisor. No abnormalities found.   MMSE - Mini Mental State Exam 08/05/2020  Orientation to time 5  Orientation to Place 5  Registration 3  Attention/ Calculation 5  Recall 3  Language- repeat 1        Immunizations Immunization History  Administered Date(s) Administered   Fluad Quad(high Dose 65+) 07/07/2020   Influenza, High Dose Seasonal PF 07/20/2021   Influenza,inj,Quad PF,6+ Mos 07/25/2017, 08/11/2018, 06/12/2019   Influenza-Unspecified 07/06/2012   PFIZER(Purple Top)SARS-COV-2 Vaccination 11/04/2019, 11/28/2019, 06/23/2020   Pneumococcal Polysaccharide-23 09/17/2019   Tdap 11/29/2013   Unspecified SARS-COV-2 Vaccination 06/23/2020   Zoster  Recombinat (Shingrix) 06/12/2019, 08/16/2019    TDAP status: Up to date  Flu Vaccine status: Up to date  Pneumococcal vaccine status: Up to date  Covid-19 vaccine status: Information provided on how to obtain vaccines.   Qualifies for Shingles Vaccine? Yes   Zostavax completed No    Shingrix Completed?: Yes  Screening Tests Health Maintenance  Topic Date Due   MAMMOGRAM  07/24/2020   COVID-19 Vaccine (4 - Booster for Pfizer series) 08/18/2020   Pneumonia Vaccine 1+ Years old (2 - PCV) 09/16/2020   TETANUS/TDAP  11/30/2023   COLONOSCOPY (Pts 45-62yrs Insurance coverage will need to be confirmed)  02/02/2026   INFLUENZA VACCINE  Completed   DEXA SCAN  Completed   Hepatitis C Screening  Completed   Zoster Vaccines- Shingrix  Completed   HPV VACCINES  Aged Out    Health Maintenance  Health Maintenance Due  Topic Date Due   MAMMOGRAM  07/24/2020   COVID-19 Vaccine (4 - Booster for Pfizer series) 08/18/2020   Pneumonia Vaccine 57+ Years old (2 - PCV) 09/16/2020    Colorectal cancer screening: Type of screening: Colonoscopy. Completed 02/03/16. Repeat every 10 years  Mammogram status: Completed 02/2021. Repeat every year. Patient plans on having report sent to office.  Bone Density status: Completed 02/2021. Results reflect: Bone density results: NORMAL. Repeat every 2 years.  Lung Cancer Screening: (Low Dose CT Chest recommended if Age 66-80 years, 30 pack-year currently smoking OR have quit w/in 15years.) does not qualify.     Additional Screening:  Hepatitis C Screening: does qualify; Completed 09/27/98  Vision Screening: Recommended annual ophthalmology exams for early detection of glaucoma and other disorders of the eye. Is the patient up to date with their annual eye exam?  Yes  Who is the provider or what is the name of the office in which the patient attends annual eye exams? Dr. Gershon Crane   Dental Screening: Recommended annual dental exams for proper oral hygiene  Community Resource Referral / Chronic Care Management: CRR required this visit?  No   CCM required this visit?  No      Plan:     I have personally reviewed and noted the following in the patient's chart:   Medical and social history Use of alcohol, tobacco or illicit drugs   Current medications and supplements including opioid prescriptions.  Functional ability and status Nutritional status Physical activity Advanced directives List of other physicians Hospitalizations, surgeries, and ER visits in previous 12 months Vitals Screenings to include cognitive, depression, and falls Referrals and appointments  In addition, I have reviewed and discussed with patient certain preventive protocols, quality metrics, and best practice recommendations. A written personalized care plan for preventive services as well as general preventive health recommendations were provided to patient.   Due to this being a telephonic visit, the after visit summary with patients personalized plan was offered to patient via mail or my-chart. Patient would like to access on my-chart.    Loma Messing, LPN   [id number]   Nurse Health Advisor  Nurse Notes: none

## 2021-08-06 ENCOUNTER — Encounter: Payer: Self-pay | Admitting: Podiatry

## 2021-08-06 ENCOUNTER — Other Ambulatory Visit: Payer: Self-pay

## 2021-08-06 ENCOUNTER — Other Ambulatory Visit: Payer: Self-pay | Admitting: Podiatry

## 2021-08-06 ENCOUNTER — Ambulatory Visit: Payer: Medicare Other | Admitting: Podiatry

## 2021-08-06 ENCOUNTER — Ambulatory Visit (INDEPENDENT_AMBULATORY_CARE_PROVIDER_SITE_OTHER): Payer: Medicare Other

## 2021-08-06 DIAGNOSIS — N952 Postmenopausal atrophic vaginitis: Secondary | ICD-10-CM | POA: Insufficient documentation

## 2021-08-06 DIAGNOSIS — M722 Plantar fascial fibromatosis: Secondary | ICD-10-CM

## 2021-08-06 DIAGNOSIS — M7671 Peroneal tendinitis, right leg: Secondary | ICD-10-CM

## 2021-08-06 DIAGNOSIS — F329 Major depressive disorder, single episode, unspecified: Secondary | ICD-10-CM | POA: Insufficient documentation

## 2021-08-06 MED ORDER — METHYLPREDNISOLONE 4 MG PO TBPK: ORAL_TABLET | ORAL | 0 refills | Status: DC

## 2021-08-06 MED ORDER — DEXAMETHASONE SODIUM PHOSPHATE 120 MG/30ML IJ SOLN
2.0000 mg | Freq: Once | INTRAMUSCULAR | Status: AC
  Administered 2021-08-06: 2 mg via INTRA_ARTICULAR

## 2021-08-06 MED ORDER — MELOXICAM 15 MG PO TABS: 15.0000 mg | ORAL_TABLET | Freq: Every day | ORAL | 3 refills | Status: DC

## 2021-08-06 NOTE — Progress Notes (Signed)
Subjective:  Patient ID: Sharon Benjamin, female    DOB: July 04, 1956,  MRN: [id number] HPI Chief Complaint  Patient presents with   Foot Pain    Lateral foot/arch right - twisted foot couple months ago, pain in the mornings but continues all day, tried rolling with bottle, stretching and Aleve   New Patient (Initial Visit)    Est pt 64    65 y.o. female presents with the above complaint.   ROS: Denies fever chills nausea vomiting muscle aches pains calf pain back pain chest pain shortness of breath.  Past Medical History:  Diagnosis Date   Allergy    Arthritis    Cataract    bi;lateral   Diverticulosis of colon    GERD (gastroesophageal reflux disease)    Hyperlipidemia    Insomnia    with prn ambien use, rare as of 2013   Thyroid disease    Past Surgical History:  Procedure Laterality Date   Abd sonogram - left renal cyst, GB polyp  03/96   ABDOMINAL HYSTERECTOMY     CATARACT EXTRACTION Bilateral    COLONOSCOPY     CT abd - left renal cyst only  03/96   Left knee arthroscopy  02/2005   TONSILLECTOMY AND ADENOIDECTOMY  Age 15   UGI - hiatal hernia  03/96    Current Outpatient Medications:    meloxicam (MOBIC) 15 MG tablet, Take 1 tablet (15 mg total) by mouth daily., Disp: 30 tablet, Rfl: 3   methylPREDNISolone (MEDROL DOSEPAK) 4 MG TBPK tablet, 6 day dose pack - take as directed, Disp: 21 tablet, Rfl: 0   benzonatate (TESSALON) 100 MG capsule, Take 1 capsule (100 mg total) by mouth 3 (three) times daily as needed for cough., Disp: 30 capsule, Rfl: 0   fexofenadine (ALLEGRA) 180 MG tablet, Take 180 mg by mouth daily as needed. , Disp: , Rfl:    lansoprazole (PREVACID) 15 MG capsule, Take 1 capsule (15 mg total) by mouth daily as needed., Disp: , Rfl:    levothyroxine (SYNTHROID) 88 MCG tablet, Take 1 tablet (88 mcg total) by mouth daily., Disp: 90 tablet, Rfl: 3   naproxen sodium (ALEVE) 220 MG tablet, Take by mouth daily as needed. , Disp: , Rfl:    simvastatin  (ZOCOR) 20 MG tablet, TAKE 1 TABLET BY MOUTH EVERYDAY AT BEDTIME, Disp: 90 tablet, Rfl: 3  Allergies  Allergen Reactions   Lipitor [Atorvastatin]     Aches.  Did tolerate simvastatin.     Review of Systems Objective:  There were no vitals filed for this visit.  General: Well developed, nourished, in no acute distress, alert and oriented x3   Dermatological: Skin is warm, dry and supple bilateral. Nails x 10 are well maintained; remaining integument appears unremarkable at this time. There are no open sores, no preulcerative lesions, no rash or signs of infection present.  Vascular: Dorsalis Pedis artery and Posterior Tibial artery pedal pulses are 2/4 bilateral with immedate capillary fill time. Pedal hair growth present. No varicosities and no lower extremity edema present bilateral.   Neruologic: Grossly intact via light touch bilateral. Vibratory intact via tuning fork bilateral. Protective threshold with Semmes Wienstein monofilament intact to all pedal sites bilateral. Patellar and Achilles deep tendon reflexes 2+ bilateral. No Babinski or clonus noted bilateral.   Musculoskeletal: No gross boney pedal deformities bilateral. No pain, crepitus, or limitation noted with foot and ankle range of motion bilateral. Muscular strength 5/5 in all groups tested bilateral.  Pain overlying the peroneus longus tendon at the cuboid arcuate portion.  She has pain on plantarflexion eversion and abduction.  Gait: Unassisted, Nonantalgic.    Radiographs:  Radiographs taken today demonstrate an osseously mature individual with a very large os perineum with a small fragment broken off distally from the os perineum and swelling along the peroneus longus tendon.  No other osseous abnormalities no acute findings are identified.  Assessment & Plan:   Assessment: Discussed etiology pathology conservative versus surgical therapies os perineum of the peroneus longus.    Plan: Discussed etiology pathology  and surgical therapies at this point time recommended immobilization after a 2 mg dexamethasone injection was performed under sterile conditions.  She tolerated procedure well without complications start her on oral anti-inflammatories we will follow-up with her in 1 month.  If not improved MRI will be necessary.     Elnor Renovato T. [place], [place]

## 2021-08-07 ENCOUNTER — Ambulatory Visit (INDEPENDENT_AMBULATORY_CARE_PROVIDER_SITE_OTHER): Payer: Medicare Other

## 2021-08-07 VITALS — Ht 65.0 in | Wt 243.0 lb

## 2021-08-07 DIAGNOSIS — Z0000 Encounter for general adult medical examination without abnormal findings: Secondary | ICD-10-CM

## 2021-08-07 NOTE — Patient Instructions (Signed)
Sharon Benjamin , Thank you for taking time to complete your Medicare Wellness Visit. I appreciate your ongoing commitment to your health goals. Please review the following plan we discussed and let me know if I can assist you in the future.   Screening recommendations/referrals: Colonoscopy: up to date, completed 02/03/16, Due 02/02/26  Mammogram: up to date , completed 02/2021, Due 02/2022, please have report sent to office for your chart Bone Density: completed 02/2021, Due 02/2023, please have report sent to office for your chart Recommended yearly ophthalmology/optometry visit for glaucoma screening and checkup Recommended yearly dental visit for hygiene and checkup  Vaccinations: Influenza vaccine: up to date Pneumococcal vaccine: up to date Tdap vaccine: up to date, completed 12/09/13, due 12/10/23 Shingles vaccine: up to date   Covid-19:newest booster available at your local pharmacy  Advanced directives: information available in office  Conditions/risks identified: see problem list  Next appointment: Follow up in one year for your annual wellness visit 08/11/22 @ 10:30am, this will be a telephone visit    Preventive Care 65 Years and Older, Female Preventive care refers to lifestyle choices and visits with your health care provider that can promote health and wellness. What does preventive care include? A yearly physical exam. This is also called an annual well check. Dental exams once or twice a year. Routine eye exams. Ask your health care provider how often you should have your eyes checked. Personal lifestyle choices, including: Daily care of your teeth and gums. Regular physical activity. Eating a healthy diet. Avoiding tobacco and drug use. Limiting alcohol use. Practicing safe sex. Taking low-dose aspirin every day. Taking vitamin and mineral supplements as recommended by your health care provider. What happens during an annual well check? The services and screenings done by  your health care provider during your annual well check will depend on your age, overall health, lifestyle risk factors, and family history of disease. Counseling  Your health care provider may ask you questions about your: Alcohol use. Tobacco use. Drug use. Emotional well-being. Home and relationship well-being. Sexual activity. Eating habits. History of falls. Memory and ability to understand (cognition). Work and work Statistician. Reproductive health. Screening  You may have the following tests or measurements: Height, weight, and BMI. Blood pressure. Lipid and cholesterol levels. These may be checked every 5 years, or more frequently if you are over 32 years old. Skin check. Lung cancer screening. You may have this screening every year starting at age 3 if you have a 30-pack-year history of smoking and currently smoke or have quit within the past 15 years. Fecal occult blood test (FOBT) of the stool. You may have this test every year starting at age 53. Flexible sigmoidoscopy or colonoscopy. You may have a sigmoidoscopy every 5 years or a colonoscopy every 10 years starting at age 39. Hepatitis C blood test. Hepatitis B blood test. Sexually transmitted disease (STD) testing. Diabetes screening. This is done by checking your blood sugar (glucose) after you have not eaten for a while (fasting). You may have this done every 1-3 years. Bone density scan. This is done to screen for osteoporosis. You may have this done starting at age 46. Mammogram. This may be done every 1-2 years. Talk to your health care provider about how often you should have regular mammograms. Talk with your health care provider about your test results, treatment options, and if necessary, the need for more tests. Vaccines  Your health care provider may recommend certain vaccines, such as: Influenza vaccine.  This is recommended every year. Tetanus, diphtheria, and acellular pertussis (Tdap, Td) vaccine. You  may need a Td booster every 10 years. Zoster vaccine. You may need this after age 51. Pneumococcal 13-valent conjugate (PCV13) vaccine. One dose is recommended after age 26. Pneumococcal polysaccharide (PPSV23) vaccine. One dose is recommended after age 76. Talk to your health care provider about which screenings and vaccines you need and how often you need them. This information is not intended to replace advice given to you by your health care provider. Make sure you discuss any questions you have with your health care provider. Document Released: 10/10/2015 Document Revised: 06/02/2016 Document Reviewed: 07/15/2015 Elsevier Interactive Patient Education  2017 [employer] Prevention in the Home Falls can cause injuries. They can happen to people of all ages. There are many things you can do to make your home safe and to help prevent falls. What can I do on the outside of my home? Regularly fix the edges of walkways and driveways and fix any cracks. Remove anything that might make you trip as you walk through a door, such as a raised step or threshold. Trim any bushes or trees on the path to your home. Use bright outdoor lighting. Clear any walking paths of anything that might make someone trip, such as rocks or tools. Regularly check to see if handrails are loose or broken. Make sure that both sides of any steps have handrails. Any raised decks and porches should have guardrails on the edges. Have any leaves, snow, or ice cleared regularly. Use sand or salt on walking paths during winter. Clean up any spills in your garage right away. This includes oil or grease spills. What can I do in the bathroom? Use night lights. Install grab bars by the toilet and in the tub and shower. Do not use towel bars as grab bars. Use non-skid mats or decals in the tub or shower. If you need to sit down in the shower, use a plastic, non-slip stool. Keep the floor dry. Clean up any water that spills  on the floor as soon as it happens. Remove soap buildup in the tub or shower regularly. Attach bath mats securely with double-sided non-slip rug tape. Do not have throw rugs and other things on the floor that can make you trip. What can I do in the bedroom? Use night lights. Make sure that you have a light by your bed that is easy to reach. Do not use any sheets or blankets that are too big for your bed. They should not hang down onto the floor. Have a firm chair that has side arms. You can use this for support while you get dressed. Do not have throw rugs and other things on the floor that can make you trip. What can I do in the kitchen? Clean up any spills right away. Avoid walking on wet floors. Keep items that you use a lot in easy-to-reach places. If you need to reach something above you, use a strong step stool that has a grab bar. Keep electrical cords out of the way. Do not use floor polish or wax that makes floors slippery. If you must use wax, use non-skid floor wax. Do not have throw rugs and other things on the floor that can make you trip. What can I do with my stairs? Do not leave any items on the stairs. Make sure that there are handrails on both sides of the stairs and use them. Fix handrails  that are broken or loose. Make sure that handrails are as long as the stairways. Check any carpeting to make sure that it is firmly attached to the stairs. Fix any carpet that is loose or worn. Avoid having throw rugs at the top or bottom of the stairs. If you do have throw rugs, attach them to the floor with carpet tape. Make sure that you have a light switch at the top of the stairs and the bottom of the stairs. If you do not have them, ask someone to add them for you. What else can I do to help prevent falls? Wear shoes that: Do not have high heels. Have rubber bottoms. Are comfortable and fit you well. Are closed at the toe. Do not wear sandals. If you use a stepladder: Make  sure that it is fully opened. Do not climb a closed stepladder. Make sure that both sides of the stepladder are locked into place. Ask someone to hold it for you, if possible. Clearly mark and make sure that you can see: Any grab bars or handrails. First and last steps. Where the edge of each step is. Use tools that help you move around (mobility aids) if they are needed. These include: Canes. Walkers. Scooters. Crutches. Turn on the lights when you go into a dark area. Replace any light bulbs as soon as they burn out. Set up your furniture so you have a clear path. Avoid moving your furniture around. If any of your floors are uneven, fix them. If there are any pets around you, be aware of where they are. Review your medicines with your doctor. Some medicines can make you feel dizzy. This can increase your chance of falling. Ask your doctor what other things that you can do to help prevent falls. This information is not intended to replace advice given to you by your health care provider. Make sure you discuss any questions you have with your health care provider. Document Released: 07/10/2009 Document Revised: 02/19/2016 Document Reviewed: 10/18/2014 Elsevier Interactive Patient Education  2017 [employer].

## 2021-09-01 ENCOUNTER — Other Ambulatory Visit: Payer: Self-pay

## 2021-09-01 ENCOUNTER — Ambulatory Visit (INDEPENDENT_AMBULATORY_CARE_PROVIDER_SITE_OTHER): Payer: Medicare Other | Admitting: Family Medicine

## 2021-09-01 ENCOUNTER — Encounter: Payer: Self-pay | Admitting: Family Medicine

## 2021-09-01 VITALS — BP 124/82 | HR 80 | Temp 98.0°F | Ht 65.0 in | Wt 238.0 lb

## 2021-09-01 DIAGNOSIS — K8020 Calculus of gallbladder without cholecystitis without obstruction: Secondary | ICD-10-CM

## 2021-09-01 DIAGNOSIS — Z0000 Encounter for general adult medical examination without abnormal findings: Secondary | ICD-10-CM

## 2021-09-01 DIAGNOSIS — Z23 Encounter for immunization: Secondary | ICD-10-CM | POA: Diagnosis not present

## 2021-09-01 DIAGNOSIS — E890 Postprocedural hypothyroidism: Secondary | ICD-10-CM | POA: Diagnosis not present

## 2021-09-01 DIAGNOSIS — E785 Hyperlipidemia, unspecified: Secondary | ICD-10-CM | POA: Diagnosis not present

## 2021-09-01 DIAGNOSIS — Z7189 Other specified counseling: Secondary | ICD-10-CM

## 2021-09-01 DIAGNOSIS — N200 Calculus of kidney: Secondary | ICD-10-CM

## 2021-09-01 DIAGNOSIS — K219 Gastro-esophageal reflux disease without esophagitis: Secondary | ICD-10-CM

## 2021-09-01 LAB — COMPREHENSIVE METABOLIC PANEL
ALT: 30 U/L (ref 0–35)
AST: 25 U/L (ref 0–37)
Albumin: 4.4 g/dL (ref 3.5–5.2)
Alkaline Phosphatase: 108 U/L (ref 39–117)
BUN: 14 mg/dL (ref 6–23)
CO2: 31 mEq/L (ref 19–32)
Calcium: 10 mg/dL (ref 8.4–10.5)
Chloride: 102 mEq/L (ref 96–112)
Creatinine, Ser: 0.79 mg/dL (ref 0.40–1.20)
GFR: 77.63 mL/min (ref >60.00)
Glucose, Bld: 147 mg/dL — ABNORMAL HIGH (ref 70–99)
Potassium: 4.3 mEq/L (ref 3.5–5.1)
Sodium: 140 mEq/L (ref 135–145)
Total Bilirubin: 0.8 mg/dL (ref 0.2–1.2)
Total Protein: 7.2 g/dL (ref 6.0–8.3)

## 2021-09-01 LAB — CBC WITH DIFFERENTIAL/PLATELET
Basophils Absolute: 0 10*3/uL (ref 0.0–0.1)
Basophils Relative: 0.7 % (ref 0.0–3.0)
Eosinophils Absolute: 0.2 10*3/uL (ref 0.0–0.7)
Eosinophils Relative: 2.5 % (ref 0.0–5.0)
HCT: 42 % (ref 36.0–46.0)
Hemoglobin: 14 g/dL (ref 12.0–15.0)
Lymphocytes Relative: 21.1 % (ref 12.0–46.0)
Lymphs Abs: 1.4 10*3/uL (ref 0.7–4.0)
MCHC: 33.3 g/dL (ref 30.0–36.0)
MCV: 92.9 fl (ref 78.0–100.0)
Monocytes Absolute: 0.4 10*3/uL (ref 0.1–1.0)
Monocytes Relative: 6.1 % (ref 3.0–12.0)
Neutro Abs: 4.6 10*3/uL (ref 1.4–7.7)
Neutrophils Relative %: 69.6 % (ref 43.0–77.0)
Platelets: 220 10*3/uL (ref 150.0–400.0)
RBC: 4.52 Mil/uL (ref 3.87–5.11)
RDW: 13.5 % (ref 11.5–15.5)
WBC: 6.6 10*3/uL (ref 4.0–10.5)

## 2021-09-01 LAB — LIPID PANEL
Cholesterol: 187 mg/dL (ref 0–200)
HDL: 36.8 mg/dL — ABNORMAL LOW (ref >39.00)
LDL Cholesterol: 120 mg/dL — ABNORMAL HIGH (ref 0–99)
NonHDL: 149.75
Total CHOL/HDL Ratio: 5
Triglycerides: 148 mg/dL (ref 0.0–149.0)
VLDL: 29.6 mg/dL (ref 0.0–40.0)

## 2021-09-01 LAB — VITAMIN D 25 HYDROXY (VIT D DEFICIENCY, FRACTURES): VITD: 19.21 ng/mL — ABNORMAL LOW (ref 30.00–100.00)

## 2021-09-01 LAB — TSH: TSH: 2.91 u[IU]/mL (ref 0.35–5.50)

## 2021-09-01 MED ORDER — LEVOTHYROXINE SODIUM 88 MCG PO TABS: 88.0000 ug | ORAL_TABLET | Freq: Every day | ORAL | 3 refills | Status: DC

## 2021-09-01 MED ORDER — SIMVASTATIN 20 MG PO TABS: ORAL_TABLET | ORAL | 3 refills | Status: DC

## 2021-09-01 NOTE — Patient Instructions (Addendum)
Please call urology about stone recommendations. [phone]. Go to the lab on the way out.   If you have mychart we'll likely use that to update you.    Take care.  Glad to see you. Update me as needed.

## 2021-09-01 NOTE — Progress Notes (Signed)
This visit occurred during the SARS-CoV-2 public health emergency.  Safety protocols were in place, including screening questions prior to the visit, additional usage of staff PPE, and extensive cleaning of exam room while observing appropriate contact time as indicated for disinfecting solutions.  Elevated Cholesterol: Using medications without problems:yes Muscle aches: no Diet compliance: encouraged.   Exercise: limited by CAM walker boot.    R foot in CAM walker for tendonitis per podiatry.  I'll defer.  She agrees.    Hypothyroidism.  Compliant. TSH pending.  No neck mass or dysphagia.    GERD.  Still on PPI.  Doing well with rare sx. Compliant.    Her sister is currently inpatient with fluid overload and recent cancer diagnosis.  She doesn't know a know primary yet.  Her husband has MAC lung complications.  She has had a tough year.    Prev eval for renal stones per urology.  She passed 1 stone but still has bilateral nonobstructing stones.    Cholelithiasis and nephrolithiasis noted on imaging along with hepatic steatosis and hepatomegaly. Will request records from urology.  See notes on labs today.  No RUQ pain.  Routine cautions d/w pt.  She reasonably declined general surgery referral at this point given her situation and lack of symptoms.    Flu 2022 Shingles previously done PNA 2022 Tetanus 2015 covid vaccine 2021 Colon cancer screening with colonoscopy 2017 Breast cancer screening 2022 [facility]   Advance directive-husband designated if patient were incapacitated.  PMH and SH reviewed  Meds, vitals, and allergies reviewed.   ROS: Per HPI unless specifically indicated in ROS section   GEN: nad, alert and oriented HEENT: ncat NECK: supple w/o LA CV: rrr. PULM: ctab, no inc wob ABD: soft, +bs EXT: no edema SKIN: Well-perfused. Right foot in Cam walker.

## 2021-09-02 ENCOUNTER — Other Ambulatory Visit: Payer: Self-pay | Admitting: Family Medicine

## 2021-09-02 DIAGNOSIS — L723 Sebaceous cyst: Secondary | ICD-10-CM | POA: Diagnosis not present

## 2021-09-02 DIAGNOSIS — K8020 Calculus of gallbladder without cholecystitis without obstruction: Secondary | ICD-10-CM | POA: Insufficient documentation

## 2021-09-02 DIAGNOSIS — L578 Other skin changes due to chronic exposure to nonionizing radiation: Secondary | ICD-10-CM | POA: Diagnosis not present

## 2021-09-02 DIAGNOSIS — D485 Neoplasm of uncertain behavior of skin: Secondary | ICD-10-CM | POA: Diagnosis not present

## 2021-09-02 DIAGNOSIS — L821 Other seborrheic keratosis: Secondary | ICD-10-CM | POA: Diagnosis not present

## 2021-09-02 DIAGNOSIS — D225 Melanocytic nevi of trunk: Secondary | ICD-10-CM | POA: Diagnosis not present

## 2021-09-02 DIAGNOSIS — B078 Other viral warts: Secondary | ICD-10-CM | POA: Diagnosis not present

## 2021-09-02 DIAGNOSIS — E890 Postprocedural hypothyroidism: Secondary | ICD-10-CM

## 2021-09-02 DIAGNOSIS — R739 Hyperglycemia, unspecified: Secondary | ICD-10-CM

## 2021-09-02 DIAGNOSIS — K219 Gastro-esophageal reflux disease without esophagitis: Secondary | ICD-10-CM | POA: Insufficient documentation

## 2021-09-02 DIAGNOSIS — N200 Calculus of kidney: Secondary | ICD-10-CM | POA: Insufficient documentation

## 2021-09-02 DIAGNOSIS — Z86018 Personal history of other benign neoplasm: Secondary | ICD-10-CM | POA: Diagnosis not present

## 2021-09-02 DIAGNOSIS — E559 Vitamin D deficiency, unspecified: Secondary | ICD-10-CM

## 2021-09-02 DIAGNOSIS — L814 Other melanin hyperpigmentation: Secondary | ICD-10-CM | POA: Diagnosis not present

## 2021-09-02 MED ORDER — VITAMIN D (ERGOCALCIFEROL) 1.25 MG (50000 UNIT) PO CAPS: 50000.0000 [IU] | ORAL_CAPSULE | ORAL | 0 refills | Status: DC

## 2021-09-02 NOTE — Assessment & Plan Note (Signed)
Requesting urology records.

## 2021-09-02 NOTE — Assessment & Plan Note (Signed)
Still on PPI.  Doing well with rare sx. Compliant.   Would continue Prevacid as is.

## 2021-09-02 NOTE — Assessment & Plan Note (Signed)
Continue simvastatin.  Continue work on diet.  Discussed increasing exercise after she gets out of the cam walker, when possible.  See notes on labs.

## 2021-09-02 NOTE — Assessment & Plan Note (Signed)
  Flu 2022 Shingles previously done PNA 2022 Tetanus 2015 covid vaccine 2021 Colon cancer screening with colonoscopy 2017 Breast cancer screening 2022 Bone density test 20[facility].   Advance directive-husband designated if patient were incapacitated.

## 2021-09-02 NOTE — Assessment & Plan Note (Signed)
Compliant. TSH pending.  No neck mass or dysphagia.   Continue levothyroxine 88 mcg a day.

## 2021-09-02 NOTE — Assessment & Plan Note (Signed)
Advance directive- husband designated if patient were incapacitated.  

## 2021-09-02 NOTE — Assessment & Plan Note (Signed)
Cholelithiasis noted on imaging along with hepatic steatosis and hepatomegaly. Will request records from urology.  See notes on labs today.  No RUQ pain.  Routine cautions d/w pt.  She reasonably declined general surgery referral at this point given her situation and lack of symptoms.  Advised to avoid fatty foods.

## 2021-09-08 ENCOUNTER — Other Ambulatory Visit: Payer: Self-pay

## 2021-09-08 ENCOUNTER — Ambulatory Visit: Payer: Medicare Other | Admitting: Podiatry

## 2021-09-08 ENCOUNTER — Encounter: Payer: Self-pay | Admitting: Podiatry

## 2021-09-08 DIAGNOSIS — S86311A Strain of muscle(s) and tendon(s) of peroneal muscle group at lower leg level, right leg, initial encounter: Secondary | ICD-10-CM | POA: Diagnosis not present

## 2021-09-08 DIAGNOSIS — Z87898 Personal history of other specified conditions: Secondary | ICD-10-CM | POA: Insufficient documentation

## 2021-09-08 DIAGNOSIS — D2261 Melanocytic nevi of right upper limb, including shoulder: Secondary | ICD-10-CM | POA: Insufficient documentation

## 2021-09-08 DIAGNOSIS — D225 Melanocytic nevi of trunk: Secondary | ICD-10-CM | POA: Insufficient documentation

## 2021-09-08 DIAGNOSIS — L719 Rosacea, unspecified: Secondary | ICD-10-CM | POA: Insufficient documentation

## 2021-09-08 DIAGNOSIS — D2370 Other benign neoplasm of skin of unspecified lower limb, including hip: Secondary | ICD-10-CM | POA: Insufficient documentation

## 2021-09-08 DIAGNOSIS — L723 Sebaceous cyst: Secondary | ICD-10-CM | POA: Insufficient documentation

## 2021-09-08 NOTE — Progress Notes (Signed)
She presents today for follow-up of her peroneus longus tendinitis right foot.  She relates that is 75 to 80% improved while in the boot.  Worsens without the boot.  Objective: Vital signs are stable alert and oriented x3 exquisite tenderness on palpation of the peroneus longus at the arcuate portion of the cuboid.  Review of radiographs do demonstrate an os perineum.  Assessment: Os perineum probable tear of the peroneus longus.  Plan: Discussed etiology pathology conservative versus surgical therapies at this point I am going to go ahead and recommend an MRI for evaluation of that tendon and os perineum.  She is to continue to wear the boot and all other conservative therapies until we notify her of the results.

## 2021-09-25 ENCOUNTER — Ambulatory Visit
Admission: RE | Admit: 2021-09-25 | Discharge: 2021-09-25 | Disposition: A | Discharge status: Home / Self Care | Payer: Medicare Other | Source: Ambulatory Visit | Attending: Podiatry | Admitting: Podiatry

## 2021-09-25 ENCOUNTER — Other Ambulatory Visit: Payer: Self-pay

## 2021-09-25 DIAGNOSIS — M7731 Calcaneal spur, right foot: Secondary | ICD-10-CM | POA: Diagnosis not present

## 2021-09-25 DIAGNOSIS — S86311A Strain of muscle(s) and tendon(s) of peroneal muscle group at lower leg level, right leg, initial encounter: Secondary | ICD-10-CM

## 2021-10-08 ENCOUNTER — Ambulatory Visit: Payer: Medicare Other | Admitting: Podiatry

## 2021-10-08 ENCOUNTER — Other Ambulatory Visit: Payer: Self-pay

## 2021-10-08 DIAGNOSIS — S86311D Strain of muscle(s) and tendon(s) of peroneal muscle group at lower leg level, right leg, subsequent encounter: Secondary | ICD-10-CM | POA: Diagnosis not present

## 2021-10-08 DIAGNOSIS — M7671 Peroneal tendinitis, right leg: Secondary | ICD-10-CM

## 2021-10-08 NOTE — Addendum Note (Signed)
Addended by: Rip Harbour on: 10/08/2021 09:33 AM   Modules accepted: Orders

## 2021-10-08 NOTE — Progress Notes (Signed)
She presents today for follow-up of her peroneal tendon pain.  She states that it has really not improved at all continues to wear her cam boot is much as possible.  Objective: Vital signs are stable alert oriented x3 minimal edema to the right ankle no erythema cellulitis drainage or odor no open lesions or wounds.  MRI does demonstrate os perineum with bony edema and a split tear of the peroneal brevis tendon right.  Assessment: Peroneal tendinitis split tear of the peroneus brevis and os perineum edema.  Edema.  Plan: At this point would not try physical therapy I did offer her PRP which she declined we did discuss possible need for surgical intervention.  Follow-up with her in the near future.

## 2021-10-13 NOTE — Therapy (Addendum)
OUTPATIENT PHYSICAL THERAPY LOWER EXTREMITY EVALUATION   Patient Name: Sharon Benjamin MRN: [id number] DOB:[birth year], 66 y.o., female Today's Date: 10/15/2021   PT End of Session - 10/15/21 1423     Visit Number 1    Number of Visits 16    Date for PT Re-Evaluation 12/10/21    Authorization Type [employer] MCR    PT Start Time 1340    PT Stop Time 1413    PT Time Calculation (min) 33 min             Past Medical History:  Diagnosis Date   Allergy    Arthritis    Cataract    bi;lateral   Diverticulosis of colon    Gallstones    GERD (gastroesophageal reflux disease)    Hyperlipidemia    Insomnia    with prn ambien use, rare as of 2013   Nephrolithiasis    Thyroid disease    Past Surgical History:  Procedure Laterality Date   Abd sonogram - left renal cyst, GB polyp  03/96   ABDOMINAL HYSTERECTOMY     CATARACT EXTRACTION Bilateral    COLONOSCOPY     CT abd - left renal cyst only  03/96   Left knee arthroscopy  02/2005   TONSILLECTOMY AND ADENOIDECTOMY  Age 20   UGI - hiatal hernia  03/96   Patient Active Problem List   Diagnosis Date Noted   Acne rosacea 09/08/2021   Benign neoplasm of skin of lower limb 09/08/2021   History of neoplasm 09/08/2021   Melanocytic nevi of right upper limb, including shoulder 09/08/2021   Melanocytic nevi of trunk 09/08/2021   Sebaceous cyst 09/08/2021   Gallstones 09/02/2021   Nephrolithiasis 09/02/2021   GERD (gastroesophageal reflux disease) 09/02/2021   Atrophic vaginitis 08/06/2021   Reactive depression (situational) 08/06/2021   Healthcare maintenance 08/13/2020   Shoulder pain 09/19/2019   Knee pain 07/27/2017   Hypothyroidism following radioiodine therapy 05/09/2016   Advance care planning 01/05/2015   Hyperglycemia 01/05/2015   Medicare welcome exam 11/30/2013   Irritability 11/30/2013   Obesity (BMI 30-39.9) 12/07/2012   Fibroids 12/06/2012   Multiple thyroid nodules 08/30/2012   HLD (hyperlipidemia) 07/26/2007    PAP SMEAR, ABNORMAL 07/26/2007    PCP: Tonia Ghent, MD  REFERRING PROVIDER: Garrel Ridgel, DPM  REFERRING DIAG: Referral diagnosis: Tear of peroneal tendon, right, subsequent encounter [S86.311D], Peroneal tendinitis of right lower extremity [M76.71]  THERAPY DIAG:  Pain in right ankle and joints of right foot  Localized edema  Unsteadiness on feet  ONSET DATE: early November 2023  SUBJECTIVE:  Sharon Benjamin is a 66 y.o. female who presents to clinic with chief complaint of R lateral ankle and plantar pain.  MOI/History of condition:  Rolled ankle in summer 2022 painful through the summer and fall, then started increasing in intensity around Thanksgiving 2022.  Hx of bil plantar fasciitis.   Podiatrist put her in boot around Thanksgiving.  The boot was somewhat helpful, but her pain remains.  From referring provider:   "She presents today for follow-up of her peroneal tendon pain.  She states that it has really not improved at all continues to wear her cam boot is much as possible.  Objective: Vital signs are stable alert oriented x3 minimal edema to the right ankle no erythema cellulitis drainage or odor no open lesions or wounds.  MRI does demonstrate os perineum with bony edema and a split tear of the peroneal brevis tendon right.  Assessment: Peroneal tendinitis split tear of the peroneus brevis and os perineum edema.  Edema.  Plan: At this point would not try physical therapy I did offer her PRP which she declined we did discuss possible need for surgical intervention.  Follow-up with her in the near future."   Red flags:  denies   Pertinent past history:  none  Pain:  Are you having pain? Yes Pain location: lateral R ankle and plantar surface of R foot to first met head NPRS  scale:  highest 6/10 current 3/10  best 2/10 Aggravating factors: walking, standing  Relieving factors: rest, ice Pain description: constant, sharp, and dull Severity: moderate Irritability: moderate Stage: Chronic Stability: staying the same 24 hour pattern: no clear pattern   Occupation: retired  Hobbies/Recreation: traveling, Occupational hygienist: none  Patient Goals: avoid surgery, reduce pain, be able to walk without pain   PRECAUTIONS: None  WEIGHT BEARING RESTRICTIONS No  FALLS:  Has patient fallen in last 6 months? No, Number of falls: 0  LIVING ENVIRONMENT: Lives with: lives with their family Stairs: No;   PLOF: Independent  DIAGNOSTIC FINDINGS:   MRI:  IMPRESSION: 1. Os peroneus with bone marrow edema within the accessory ossicle as can be seen with painful os peroneus syndrome. 2. Mild tendinosis of the peroneus brevis with a small partial-thickness tear just beyond the lateral malleolus.   OBJECTIVE:    GENERAL OBSERVATION:   High arches  SENSATION:  Light touch: Appears intact  PALPATION: TTP base of 5th and 1st met head, some mild swelling below lateral malleolus  LE ROM:  MMT Right 10/15/2021 Left 10/15/2021  Hip flexion (L2, L3)    Knee extension (L3)    Knee flexion    Hip abduction    Hip extension    Hip external rotation    Hip internal rotation    Hip adduction    Ankle dorsiflexion (L4) 4 OC 8 OC  Ankle plantarflexion (S1) N N  Ankle inversion 45 45  Ankle eversion 30 25  Great Toe ext (L5)        (Blank rows = not tested, * = concordant pain with testing)   LE MMT:  ROM Right 10/15/2021 Left 10/15/2021  Hip flexion    Hip extension    Hip abduction    Hip adduction    Hip internal rotation    Hip external rotation    Knee flexion    Knee extension    Ankle dorsiflexion 5 5  Ankle plantarflexion 4* 5  Ankle inversion 5 5  Ankle eversion 4* 5   (  Blank rows = not tested, score listed is out of 5  possible points.  N = WNL, D = diminished, C = clear for gross weakness with myotome testing, * = concordant pain with testing)   FUNCTIONAL TESTS:   Progressive balance screen:  Feet together: 10'' Semi Tandem: R in rear 10'', L in rear 10'' Tandem: R in rear unable, L in rear unable   GAIT: Comments: slight reduced time in stance R  PATIENT SURVEYS:  Take FOTO next visit    TODAY'S TREATMENT: Creating, reviewing, and completing below HEP   PATIENT EDUCATION:  POC, diagnosis, prognosis, HEP, and outcome measures.  Pt educated via explanation, demonstration, and handout (HEP).  Pt confirms understanding verbally.    HOME EXERCISE PROGRAM: Access Code: [email or web address] URL: [email or web address] Date: 10/15/2021 Prepared by: Shearon Balo  Exercises Seated Toe Towel Scrunches - 2 x daily - 7 x weekly - 3 sets - 10 reps Long Sitting Ankle Plantar Flexion with Resistance - 2 x daily - 7 x weekly - 3 sets - 10 reps Ankle Inversion Eversion Towel Slide - 2 x daily - 7 x weekly - 3 sets - 10 reps    ASSESSMENT:  CLINICAL IMPRESSION: Twyla is a 66 y.o. female who presents to clinic with signs and sxs consistent with R lateral ankle and plantar pain secondary to partial tear of peroneal tendon.  PT in cam boot for ~8 weeks which she still wears some time.  I recommended she move to ASO; she agrees with plan.  We need to progressively strengthen at this point and see if this helps her pain so she can avoid surgery.  Patient presents with pain and impairments/deficits in: ankle strength, balance, and gait.  Activity limitations include: ambulation, step navigation.  Participation limitations include: walking for exercise and yardwork.  Patient will benefit from skilled therapy to address pain and the listed deficits in order to achieve functional goals, enable safety and independence in completion of daily tasks, and return to PLOF.   REHAB POTENTIAL: Good  CLINICAL  DECISION MAKING: Stable/uncomplicated  EVALUATION COMPLEXITY: Low   GOALS:  SHORT TERM GOALS:  STG Name Target Date Goal status  1 Andera will be >75% HEP compliant to improve carryover between sessions and facilitate independent management of condition  Baseline: No HEP 11/05/2021 INITIAL   LONG TERM GOALS:   LTG Name Target Date Goal status  1 Danaya will be able to complete 10x R single leg heel raises, not limited by pain  Eval: limited by pain 12/10/2021 INITIAL  2 FOTO goal 12/10/2021 INITIAL  3 Krislyn will report >/= 50% decrease in pain from evaluation   Baseline: 6/10 max pain 12/10/2021 INITIAL  4 Keiondra will be confident express confidence in advanced HEP in order to self manage condition 12/10/2021 INITIAL  5 Olivya will be able to stand for >30'' in tandem stance, to show a significant improvement in balance in order to reduce fall risk   Baseline: unable 12/10/2021 INITIAL   PLAN: PT FREQUENCY: 1-2x/week  PT DURATION: 8 weeks (Ending 12/10/2021)  PLANNED INTERVENTIONS: Therapeutic exercises, Therapeutic activity, Neuro Muscular re-education, Gait training, Patient/Family education, Joint mobilization, Dry Needling, Electrical stimulation, Spinal mobilization and/or manipulation, Moist heat, Taping, Vasopneumatic device, Ionotophoresis 27m/ml Dexamethasone, and Manual therapy  PLAN FOR NEXT SESSION: progressive peroneal strengthening, progressive balance   KShearon BaloPT, DPT 10/15/2021, 2:25 PM

## 2021-10-15 ENCOUNTER — Other Ambulatory Visit: Payer: Self-pay

## 2021-10-15 ENCOUNTER — Encounter: Payer: Self-pay | Admitting: Physical Therapy

## 2021-10-15 ENCOUNTER — Ambulatory Visit: Payer: Medicare Other | Attending: Podiatry | Admitting: Physical Therapy

## 2021-10-15 DIAGNOSIS — R2681 Unsteadiness on feet: Secondary | ICD-10-CM | POA: Diagnosis not present

## 2021-10-15 DIAGNOSIS — M7671 Peroneal tendinitis, right leg: Secondary | ICD-10-CM | POA: Diagnosis not present

## 2021-10-15 DIAGNOSIS — R600 Localized edema: Secondary | ICD-10-CM | POA: Diagnosis not present

## 2021-10-15 DIAGNOSIS — S86311D Strain of muscle(s) and tendon(s) of peroneal muscle group at lower leg level, right leg, subsequent encounter: Secondary | ICD-10-CM | POA: Insufficient documentation

## 2021-10-15 DIAGNOSIS — M25571 Pain in right ankle and joints of right foot: Secondary | ICD-10-CM | POA: Insufficient documentation

## 2021-10-19 ENCOUNTER — Ambulatory Visit: Payer: Medicare Other | Admitting: Physical Therapy

## 2021-10-19 ENCOUNTER — Other Ambulatory Visit: Payer: Self-pay

## 2021-10-19 ENCOUNTER — Encounter: Payer: Self-pay | Admitting: Physical Therapy

## 2021-10-19 DIAGNOSIS — R2681 Unsteadiness on feet: Secondary | ICD-10-CM | POA: Diagnosis not present

## 2021-10-19 DIAGNOSIS — M25571 Pain in right ankle and joints of right foot: Secondary | ICD-10-CM | POA: Diagnosis not present

## 2021-10-19 DIAGNOSIS — S86311D Strain of muscle(s) and tendon(s) of peroneal muscle group at lower leg level, right leg, subsequent encounter: Secondary | ICD-10-CM | POA: Diagnosis not present

## 2021-10-19 DIAGNOSIS — R600 Localized edema: Secondary | ICD-10-CM

## 2021-10-19 DIAGNOSIS — Z961 Presence of intraocular lens: Secondary | ICD-10-CM | POA: Diagnosis not present

## 2021-10-19 DIAGNOSIS — H52203 Unspecified astigmatism, bilateral: Secondary | ICD-10-CM | POA: Diagnosis not present

## 2021-10-19 DIAGNOSIS — M7671 Peroneal tendinitis, right leg: Secondary | ICD-10-CM | POA: Diagnosis not present

## 2021-10-19 DIAGNOSIS — H524 Presbyopia: Secondary | ICD-10-CM | POA: Diagnosis not present

## 2021-10-19 NOTE — Therapy (Signed)
OUTPATIENT PHYSICAL THERAPY TREATMENT NOTE   Patient Name: Sharon Benjamin MRN: [id number] DOB:1955-12-17, 66 y.o., female Today's Date: 10/19/2021  PCP: Tonia Ghent, MD REFERRING PROVIDER: Tonia Ghent, MD   PT End of Session - 10/19/21 0840     Visit Number 2    Number of Visits 16    Date for PT Re-Evaluation 12/10/21    Authorization Type [employer] MCR- FOTO 6th and 10th    PT Start Time 0845    PT Stop Time 0927    PT Time Calculation (min) 42 min             Past Medical History:  Diagnosis Date   Allergy    Arthritis    Cataract    bi;lateral   Diverticulosis of colon    Gallstones    GERD (gastroesophageal reflux disease)    Hyperlipidemia    Insomnia    with prn ambien use, rare as of 2013   Nephrolithiasis    Thyroid disease    Past Surgical History:  Procedure Laterality Date   Abd sonogram - left renal cyst, GB polyp  03/96   ABDOMINAL HYSTERECTOMY     CATARACT EXTRACTION Bilateral    COLONOSCOPY     CT abd - left renal cyst only  03/96   Left knee arthroscopy  02/2005   TONSILLECTOMY AND ADENOIDECTOMY  Age 53   UGI - hiatal hernia  03/96   Patient Active Problem List   Diagnosis Date Noted   Acne rosacea 09/08/2021   Benign neoplasm of skin of lower limb 09/08/2021   History of neoplasm 09/08/2021   Melanocytic nevi of right upper limb, including shoulder 09/08/2021   Melanocytic nevi of trunk 09/08/2021   Sebaceous cyst 09/08/2021   Gallstones 09/02/2021   Nephrolithiasis 09/02/2021   GERD (gastroesophageal reflux disease) 09/02/2021   Atrophic vaginitis 08/06/2021   Reactive depression (situational) 08/06/2021   Healthcare maintenance 08/13/2020   Shoulder pain 09/19/2019   Knee pain 07/27/2017   Hypothyroidism following radioiodine therapy 05/09/2016   Advance care planning 01/05/2015   Hyperglycemia 01/05/2015   Medicare welcome exam 11/30/2013   Irritability 11/30/2013   Obesity (BMI 30-39.9) 12/07/2012   Fibroids  12/06/2012   Multiple thyroid nodules 08/30/2012   HLD (hyperlipidemia) 07/26/2007   PAP SMEAR, ABNORMAL 07/26/2007   Referring Provider: Garrel Ridgel, DPM   REFERRING DIAG:  Tear of peroneal tendon, right, subsequent encounter [S86.311D], Peroneal tendinitis of right lower extremity [M76.71]    THERAPY DIAG:  Pain in right ankle and joints of right foot  Localized edema  Unsteadiness on feet  PERTINENT HISTORY: None  PRECAUTIONS: PRECAUTIONS: None   WEIGHT BEARING RESTRICTIONS No  SUBJECTIVE: Exercises are going okay. I ordered an ASO but it has not arrived. I am still wearing the boot every day. I did not wear it today.   Pain:  Are you having pain? Yes Pain location: lateral R ankle and plantar surface of R foot to first met head NPRS scale:  highest 6/10 current 3-4//10  best 2/10 Aggravating factors: walking, standing  Relieving factors: rest, ice Pain description: constant, sharp, and dull Severity: moderate Irritability: moderate Stage: Chronic Stability: staying the same 24 hour pattern: no clear pattern    OBJECTIVE:              GENERAL OBSERVATION:                     High arches   SENSATION:  Light touch: Appears intact   PALPATION: TTP base of 5th and 1st met head, some mild swelling below lateral malleolus   LE ROM:   MMT Right 10/15/2021 Left 10/15/2021  Hip flexion (L2, L3)      Knee extension (L3)      Knee flexion      Hip abduction      Hip extension      Hip external rotation      Hip internal rotation      Hip adduction      Ankle dorsiflexion (L4) 4 OC 8 OC  Ankle plantarflexion (S1) N N  Ankle inversion 45 45  Ankle eversion 30 25  Great Toe ext (L5)                                     (Blank rows = not tested, * = concordant pain with testing)     LE MMT:   ROM Right 10/15/2021 Left 10/15/2021  Hip flexion      Hip extension      Hip abduction      Hip adduction      Hip internal rotation      Hip external  rotation      Knee flexion      Knee extension      Ankle dorsiflexion 5 5  Ankle plantarflexion 4* 5  Ankle inversion 5 5  Ankle eversion 4* 5    (Blank rows = not tested, score listed is out of 5 possible points.  N = WNL, D = diminished, C = clear for gross weakness with myotome testing, * = concordant pain with testing)    FUNCTIONAL TESTS:    Progressive balance screen:   Feet together: 10'' Semi Tandem: R in rear 10'', L in rear 10'' Tandem: R in rear unable, L in rear unable     GAIT: Comments: slight reduced time in stance R   PATIENT SURVEYS:  FOTO 44% predicted to improved to 62%     Today's Treatment: 10/19/21            Therapeutic Exercise:             -Rec bike L2 x 5 minutes             -Seated towel scrunches            -seated towel slides for IR/ER            -seated heel raise, toe raise             - red band 4 way ankle (except black band for PF)            -semi tandem EC            -tandem x 30 sec each  Initial TREATMENT: Creating, reviewing, and completing below HEP     PATIENT EDUCATION:  Updated HEP     HOME EXERCISE PROGRAM: Access Code: [email or web address] URL: [email or web address] Date: 10/19/2021 Prepared by: Hessie Diener  Exercises Seated Toe Towel Scrunches - 2 x daily - 7 x weekly - 3 sets - 10 reps Long Sitting Ankle Plantar Flexion with Resistance - 2 x daily - 7 x weekly - 3 sets - 10 reps Ankle Inversion Eversion Towel Slide - 2 x daily - 7 x weekly - 3 sets - 10 reps Seated Ankle Eversion with Resistance - 1 x  daily - 7 x weekly - 2-3 sets - 10 reps Seated Ankle Dorsiflexion with Resistance - 1 x daily - 7 x weekly - 2-3 sets - 10 reps Seated Ankle Inversion with Resistance and Legs Crossed - 1 x daily - 7 x weekly - 2-3 sets - 10 reps Tandem Stance with Support - 1 x daily - 7 x weekly - 1 sets - 2 reps - 30 hold        ASSESSMENT:   CLINICAL IMPRESSION: Pt arrives in sneakers and reports that her ASO might  arrive today. She is still wearing cam boot full time for now except today. Pt repots compliance with HEP. Reviewed HEP and progressed with resisted ankle exercises and balance. She reported some pulling and pressure but not really pain. She was given and updated HEP and cautioned to not increased her pain with her HEP.   Patient will benefit from skilled therapy to address pain and the listed deficits in order to achieve functional goals, enable safety and independence in completion of daily tasks, and return to PLOF.     REHAB POTENTIAL: Good   CLINICAL DECISION MAKING: Stable/uncomplicated   EVALUATION COMPLEXITY: Low     GOALS:   SHORT TERM GOALS:   STG Name Target Date Goal status  1 Katha will be >75% HEP compliant to improve carryover between sessions and facilitate independent management of condition   Baseline: No HEP 11/05/2021 INITIAL    LONG TERM GOALS:    LTG Name Target Date Goal status  1 Carrin will be able to complete 10x R single leg heel raises, not limited by pain   Eval: limited by pain 12/10/2021 INITIAL  2 FOTO goal 12/10/2021 INITIAL  3 Marlys will report >/= 50% decrease in pain from evaluation    Baseline: 6/10 max pain 12/10/2021 INITIAL  4 Louvina will be confident express confidence in advanced HEP in order to self manage condition 12/10/2021 INITIAL  5 Sulema will be able to stand for >30'' in tandem stance, to show a significant improvement in balance in order to reduce fall risk    Baseline: unable 12/10/2021 INITIAL    PLAN: PT FREQUENCY: 1-2x/week   PT DURATION: 8 weeks (Ending 12/10/2021)   PLANNED INTERVENTIONS: Therapeutic exercises, Therapeutic activity, Neuro Muscular re-education, Gait training, Patient/Family education, Joint mobilization, Dry Needling, Electrical stimulation, Spinal mobilization and/or manipulation, Moist heat, Taping, Vasopneumatic device, Ionotophoresis 43m/ml Dexamethasone, and Manual therapy   PLAN FOR NEXT SESSION:  progressive peroneal strengthening, progressive balance      JHessie Diener PTA 10/19/21 10:43 AM Phone: [phone]: [phone]

## 2021-10-23 ENCOUNTER — Ambulatory Visit: Payer: Medicare Other | Admitting: Physical Therapy

## 2021-10-23 ENCOUNTER — Encounter: Payer: Self-pay | Admitting: Physical Therapy

## 2021-10-23 ENCOUNTER — Other Ambulatory Visit: Payer: Self-pay

## 2021-10-23 DIAGNOSIS — R600 Localized edema: Secondary | ICD-10-CM | POA: Diagnosis not present

## 2021-10-23 DIAGNOSIS — R2681 Unsteadiness on feet: Secondary | ICD-10-CM

## 2021-10-23 DIAGNOSIS — M25571 Pain in right ankle and joints of right foot: Secondary | ICD-10-CM | POA: Diagnosis not present

## 2021-10-23 DIAGNOSIS — S86311D Strain of muscle(s) and tendon(s) of peroneal muscle group at lower leg level, right leg, subsequent encounter: Secondary | ICD-10-CM | POA: Diagnosis not present

## 2021-10-23 DIAGNOSIS — M7671 Peroneal tendinitis, right leg: Secondary | ICD-10-CM | POA: Diagnosis not present

## 2021-10-23 NOTE — Therapy (Addendum)
OUTPATIENT PHYSICAL THERAPY TREATMENT NOTE   Patient Name: Sharon Benjamin MRN: [id number] DOB:12/19/1955, 66 y.o., female Today's Date: 10/23/2021  PCP: Tonia Ghent, MD REFERRING PROVIDER: Garrel Ridgel, DPM   PT End of Session - 10/23/21 1210     Visit Number 3    Number of Visits 16    Date for PT Re-Evaluation 12/10/21    Authorization Type [employer] MCR- FOTO 6th and 10th    PT Start Time 1210    PT Stop Time 1250    PT Time Calculation (min) 40 min             Past Medical History:  Diagnosis Date   Allergy    Arthritis    Cataract    bi;lateral   Diverticulosis of colon    Gallstones    GERD (gastroesophageal reflux disease)    Hyperlipidemia    Insomnia    with prn ambien use, rare as of 2013   Nephrolithiasis    Thyroid disease    Past Surgical History:  Procedure Laterality Date   Abd sonogram - left renal cyst, GB polyp  03/96   ABDOMINAL HYSTERECTOMY     CATARACT EXTRACTION Bilateral    COLONOSCOPY     CT abd - left renal cyst only  03/96   Left knee arthroscopy  02/2005   TONSILLECTOMY AND ADENOIDECTOMY  Age 73   UGI - hiatal hernia  03/96   Patient Active Problem List   Diagnosis Date Noted   Acne rosacea 09/08/2021   Benign neoplasm of skin of lower limb 09/08/2021   History of neoplasm 09/08/2021   Melanocytic nevi of right upper limb, including shoulder 09/08/2021   Melanocytic nevi of trunk 09/08/2021   Sebaceous cyst 09/08/2021   Gallstones 09/02/2021   Nephrolithiasis 09/02/2021   GERD (gastroesophageal reflux disease) 09/02/2021   Atrophic vaginitis 08/06/2021   Reactive depression (situational) 08/06/2021   Healthcare maintenance 08/13/2020   Shoulder pain 09/19/2019   Knee pain 07/27/2017   Hypothyroidism following radioiodine therapy 05/09/2016   Advance care planning 01/05/2015   Hyperglycemia 01/05/2015   Medicare welcome exam 11/30/2013   Irritability 11/30/2013   Obesity (BMI 30-39.9) 12/07/2012   Fibroids 12/06/2012    Multiple thyroid nodules 08/30/2012   HLD (hyperlipidemia) 07/26/2007   PAP SMEAR, ABNORMAL 07/26/2007   Referring Provider: Garrel Ridgel, DPM   REFERRING DIAG:  Tear of peroneal tendon, right, subsequent encounter [S86.311D], Peroneal tendinitis of right lower extremity [M76.71]    THERAPY DIAG:  Pain in right ankle and joints of right foot  Localized edema  Unsteadiness on feet  PERTINENT HISTORY: None  PRECAUTIONS:   PRECAUTIONS: None   WEIGHT BEARING RESTRICTIONS No  SUBJECTIVE:  Pt reports that she is still waiting on her ASO to arrive it has been delayed in shipping.  She is sore after her exercises, but "may" be feeling a little better  Pain:  Are you having pain? Yes Pain location: lateral R ankle and plantar surface of R foot to first met head NPRS scale: 0/10 Aggravating factors: walking, standing  Relieving factors: rest, ice Pain description: constant, sharp, and dull Severity: moderate Irritability: moderate Stage: Chronic Stability: staying the same 24 hour pattern: no clear pattern    OBJECTIVE:              GENERAL OBSERVATION:                     High arches   SENSATION:  Light touch: Appears intact   PALPATION: TTP base of 5th and 1st met head, some mild swelling below lateral malleolus   LE ROM:   MMT Right 10/15/2021 Left 10/15/2021  Hip flexion (L2, L3)      Knee extension (L3)      Knee flexion      Hip abduction      Hip extension      Hip external rotation      Hip internal rotation      Hip adduction      Ankle dorsiflexion (L4) 4 OC 8 OC  Ankle plantarflexion (S1) N N  Ankle inversion 45 45  Ankle eversion 30 25  Great Toe ext (L5)                                     (Blank rows = not tested, * = concordant pain with testing)     LE MMT:   ROM Right 10/15/2021 Left 10/15/2021  Hip flexion      Hip extension      Hip abduction      Hip adduction      Hip internal rotation      Hip external rotation       Knee flexion      Knee extension      Ankle dorsiflexion 5 5  Ankle plantarflexion 4* 5  Ankle inversion 5 5  Ankle eversion 4* 5    (Blank rows = not tested, score listed is out of 5 possible points.  N = WNL, D = diminished, C = clear for gross weakness with myotome testing, * = concordant pain with testing)    FUNCTIONAL TESTS:    Progressive balance screen:   Feet together: 10'' Semi Tandem: R in rear 10'', L in rear 10'' Tandem: R in rear unable, L in rear unable     GAIT: Comments: slight reduced time in stance R   PATIENT SURVEYS:  FOTO 44% predicted to improved to 62%   Treatment: 10/23/21            Therapeutic Exercise:             -Rec bike L3 x 5 minutes             - BAPS board fwd/bck lat CW/CC - L3  - slant board stretch 45'' x3            -seated heel raise, toe raise (NT)            - red band inv/eversion/DF - rocker board DF/PF - 2x20             -semi tandem EC            -tandem x 30 sec each  -tandem on foam 45''x2 ea     Treatment: 10/19/21            Therapeutic Exercise:             -Rec bike L2 x 5 minutes             -Seated towel scrunches            -seated towel slides for IR/ER            -seated heel raise, toe raise             - red band 4 way ankle (except black band  for PF)            -semi tandem EC            -tandem x 30 sec each  Initial TREATMENT: Creating, reviewing, and completing below HEP     PATIENT EDUCATION:  Updated HEP     HOME EXERCISE PROGRAM: Access Code: [email or web address] URL: [email or web address] Date: 10/19/2021 Prepared by: Hessie Diener  Exercises Seated Toe Towel Scrunches - 2 x daily - 7 x weekly - 3 sets - 10 reps Long Sitting Ankle Plantar Flexion with Resistance - 2 x daily - 7 x weekly - 3 sets - 10 reps Ankle Inversion Eversion Towel Slide - 2 x daily - 7 x weekly - 3 sets - 10 reps Seated Ankle Eversion with Resistance - 1 x daily - 7 x weekly - 2-3 sets - 10 reps Seated Ankle  Dorsiflexion with Resistance - 1 x daily - 7 x weekly - 2-3 sets - 10 reps Seated Ankle Inversion with Resistance and Legs Crossed - 1 x daily - 7 x weekly - 2-3 sets - 10 reps Tandem Stance with Support - 1 x daily - 7 x weekly - 1 sets - 2 reps - 30 hold        ASSESSMENT:   CLINICAL IMPRESSION: Sharon Benjamin is progressing well with therapy.  Pt reports a mild increase in pain following therapy.  Today we concentrated on ankle strengthening and balance/proprioception.  Pt progressing balance very well with significant improvement since evaluation.  Some minor increase in pain to 2/10 with resistance exercises which diminishes with rest.  Consider adding in some hip abd exercises next visit.  Pt will continue to benefit from skilled physical therapy to address remaining deficits and achieve listed goals.  Continue per POC.     REHAB POTENTIAL: Good   CLINICAL DECISION MAKING: Stable/uncomplicated   EVALUATION COMPLEXITY: Low     GOALS:   SHORT TERM GOALS:   STG Name Target Date Goal status  1 Sharon Benjamin will be >75% HEP compliant to improve carryover between sessions and facilitate independent management of condition   Baseline: No HEP 11/05/2021 MET 1/27    LONG TERM GOALS:    LTG Name Target Date Goal status  1 Sharon Benjamin will be able to complete 10x R single leg heel raises, not limited by pain   Eval: limited by pain 12/10/2021 INITIAL  2 Sharon Benjamin will improve FOTO score from 44 (baseline) to 62 as a proxy for functional improvement 12/10/2021 INITIAL  3 Sharon Benjamin will report >/= 50% decrease in pain from evaluation    Baseline: 6/10 max pain 12/10/2021 INITIAL  4 Sharon Benjamin will be confident express confidence in advanced HEP in order to self manage condition 12/10/2021 INITIAL  5 Sharon Benjamin will be able to stand for >30'' in tandem stance, to show a significant improvement in balance in order to reduce fall risk    Baseline: unable 12/10/2021 INITIAL    PLAN: PT FREQUENCY: 1-2x/week   PT  DURATION: 8 weeks (Ending 12/10/2021)   PLANNED INTERVENTIONS: Therapeutic exercises, Therapeutic activity, Neuro Muscular re-education, Gait training, Patient/Family education, Joint mobilization, Dry Needling, Electrical stimulation, Spinal mobilization and/or manipulation, Moist heat, Taping, Vasopneumatic device, Ionotophoresis 47m/ml Dexamethasone, and Manual therapy   PLAN FOR NEXT SESSION: progressive peroneal strengthening, progressive balance      JHessie Diener PTA 10/23/21 12:52 PM Phone: [phone]: [phone]

## 2021-10-27 ENCOUNTER — Encounter: Payer: Self-pay | Admitting: Physical Therapy

## 2021-10-27 ENCOUNTER — Ambulatory Visit: Payer: Medicare Other | Admitting: Physical Therapy

## 2021-10-27 ENCOUNTER — Other Ambulatory Visit: Payer: Self-pay

## 2021-10-27 DIAGNOSIS — M25571 Pain in right ankle and joints of right foot: Secondary | ICD-10-CM | POA: Diagnosis not present

## 2021-10-27 DIAGNOSIS — R600 Localized edema: Secondary | ICD-10-CM | POA: Diagnosis not present

## 2021-10-27 DIAGNOSIS — R2681 Unsteadiness on feet: Secondary | ICD-10-CM | POA: Diagnosis not present

## 2021-10-27 DIAGNOSIS — S86311D Strain of muscle(s) and tendon(s) of peroneal muscle group at lower leg level, right leg, subsequent encounter: Secondary | ICD-10-CM | POA: Diagnosis not present

## 2021-10-27 DIAGNOSIS — M7671 Peroneal tendinitis, right leg: Secondary | ICD-10-CM | POA: Diagnosis not present

## 2021-10-27 NOTE — Therapy (Signed)
OUTPATIENT PHYSICAL THERAPY TREATMENT NOTE   Patient Name: Sharon Benjamin MRN: [id number] DOB:[birth year], 66 y.o., female Today's Date: 10/27/2021  PCP: Tonia Ghent, MD REFERRING PROVIDER: Tonia Ghent, MD   PT End of Session - 10/27/21 1154     Visit Number 4    Number of Visits 16    Date for PT Re-Evaluation 12/10/21    Authorization Type [employer] MCR- FOTO 6th and 10th    PT Start Time 1148    PT Stop Time 1226    PT Time Calculation (min) 38 min             Past Medical History:  Diagnosis Date   Allergy    Arthritis    Cataract    bi;lateral   Diverticulosis of colon    Gallstones    GERD (gastroesophageal reflux disease)    Hyperlipidemia    Insomnia    with prn ambien use, rare as of 2013   Nephrolithiasis    Thyroid disease    Past Surgical History:  Procedure Laterality Date   Abd sonogram - left renal cyst, GB polyp  03/96   ABDOMINAL HYSTERECTOMY     CATARACT EXTRACTION Bilateral    COLONOSCOPY     CT abd - left renal cyst only  03/96   Left knee arthroscopy  02/2005   TONSILLECTOMY AND ADENOIDECTOMY  Age 2   UGI - hiatal hernia  03/96   Patient Active Problem List   Diagnosis Date Noted   Acne rosacea 09/08/2021   Benign neoplasm of skin of lower limb 09/08/2021   History of neoplasm 09/08/2021   Melanocytic nevi of right upper limb, including shoulder 09/08/2021   Melanocytic nevi of trunk 09/08/2021   Sebaceous cyst 09/08/2021   Gallstones 09/02/2021   Nephrolithiasis 09/02/2021   GERD (gastroesophageal reflux disease) 09/02/2021   Atrophic vaginitis 08/06/2021   Reactive depression (situational) 08/06/2021   Healthcare maintenance 08/13/2020   Shoulder pain 09/19/2019   Knee pain 07/27/2017   Hypothyroidism following radioiodine therapy 05/09/2016   Advance care planning 01/05/2015   Hyperglycemia 01/05/2015   Medicare welcome exam 11/30/2013   Irritability 11/30/2013   Obesity (BMI 30-39.9) 12/07/2012   Fibroids  12/06/2012   Multiple thyroid nodules 08/30/2012   HLD (hyperlipidemia) 07/26/2007   PAP SMEAR, ABNORMAL 07/26/2007   Referring Provider: Garrel Ridgel, DPM   REFERRING DIAG:  Tear of peroneal tendon, right, subsequent encounter [S86.311D], Peroneal tendinitis of right lower extremity [M76.71]    THERAPY DIAG:  Pain in right ankle and joints of right foot  Localized edema  Unsteadiness on feet  PERTINENT HISTORY: None  PRECAUTIONS:   PRECAUTIONS: None   WEIGHT BEARING RESTRICTIONS No  SUBJECTIVE:  Pt reports that she received her ASO brace and is wearing it today. She has transitioned out of the boot completely. She reports 3-4/10 lateral ankle pain this morning when she first woke up. She has pain on the bottom of her foot that is more constant with ambulation.   Pain:  Are you having pain? Yes Pain location: lateral R ankle and plantar surface of R foot to first met head NPRS scale: 0/10, up to 3-4/10 Aggravating factors: walking, standing  Relieving factors: rest, ice Pain description: constant, sharp, and dull Severity: moderate Irritability: moderate Stage: Chronic Stability: staying the same 24 hour pattern: no clear pattern    OBJECTIVE:              GENERAL OBSERVATION:  High arches   SENSATION:          Light touch: Appears intact   PALPATION: TTP base of 5th and 1st met head, some mild swelling below lateral malleolus   LE ROM:   MMT Right 10/15/2021 Left 10/15/2021  Hip flexion (L2, L3)      Knee extension (L3)      Knee flexion      Hip abduction      Hip extension      Hip external rotation      Hip internal rotation      Hip adduction      Ankle dorsiflexion (L4) 4 OC 8 OC  Ankle plantarflexion (S1) N N  Ankle inversion 45 45  Ankle eversion 30 25  Great Toe ext (L5)                                     (Blank rows = not tested, * = concordant pain with testing)     LE MMT:   ROM Right 10/15/2021 Left 10/15/2021   Hip flexion      Hip extension      Hip abduction      Hip adduction      Hip internal rotation      Hip external rotation      Knee flexion      Knee extension      Ankle dorsiflexion 5 5  Ankle plantarflexion 4* 5  Ankle inversion 5 5  Ankle eversion 4* 5    (Blank rows = not tested, score listed is out of 5 possible points.  N = WNL, D = diminished, C = clear for gross weakness with myotome testing, * = concordant pain with testing)    FUNCTIONAL TESTS:    Progressive balance screen:   Feet together: 10'' Semi Tandem: R in rear 10'', L in rear 10'' Tandem: R in rear unable, L in rear unable     GAIT: Comments: slight reduced time in stance R   PATIENT SURVEYS:  FOTO 44% predicted to improved to 62%    Treatment: 10/27/21            Therapeutic Exercise:             -Rec bike L2 x 5 minutes             - BAPS board fwd/bck lat CW/CC - L3 (NT)              -seated heel raise, toe raise (NT)            -green band inv/eversion/DF - rocker board DF/PF - 2x20             -semi tandem EC (NT)            -tandem x 45 sec each EC  -SLS >45 sec each  -Seated plantar fascia stretch with instruction on self massage  -Toe Yoga  -Standing plantar fascia stretch  -standing bilat heel raise x 10  -Standing single right heel raise x 10- min increased pain in plantar surface of foot  - slant board stretch 45'' x3 gastroc and soleus   Treatment: 10/23/21            Therapeutic Exercise:             -Rec bike L3 x 5 minutes             -  BAPS board fwd/bck lat CW/CC - L3  - slant board stretch 45'' x3            -seated heel raise, toe raise (NT)            - red band inv/eversion/DF - rocker board DF/PF - 2x20             -semi tandem EC            -tandem x 30 sec each  -tandem on foam 45''x2 ea     Treatment: 10/19/21            Therapeutic Exercise:             -Rec bike L2 x 5 minutes             -Seated towel scrunches            -seated towel slides for IR/ER             -seated heel raise, toe raise             - red band 4 way ankle (except black band for PF)            -semi tandem EC            -tandem x 30 sec each  Initial TREATMENT: Creating, reviewing, and completing below HEP     PATIENT EDUCATION:  Updated HEP     HOME EXERCISE PROGRAM: Access Code: [email or web address] URL: [email or web address] Date: 10/19/2021 Prepared by: Hessie Diener  Exercises Seated Toe Towel Scrunches - 2 x daily - 7 x weekly - 3 sets - 10 reps Long Sitting Ankle Plantar Flexion with Resistance - 2 x daily - 7 x weekly - 3 sets - 10 reps Ankle Inversion Eversion Towel Slide - 2 x daily - 7 x weekly - 3 sets - 10 reps Seated Ankle Eversion with Resistance - 1 x daily - 7 x weekly - 2-3 sets - 10 reps Seated Ankle Dorsiflexion with Resistance - 1 x daily - 7 x weekly - 2-3 sets - 10 reps Seated Ankle Inversion with Resistance and Legs Crossed - 1 x daily - 7 x weekly - 2-3 sets - 10 reps Tandem Stance with Support - 1 x daily - 7 x weekly - 1 sets - 2 reps - 30 hold        ASSESSMENT:   CLINICAL IMPRESSION: Arminta is progressing well with therapy.  Pt reports a mild increase in pain following therapy.  Today we concentrated on ankle strengthening and balance/proprioception.  Pt progressing balance very well with significant improvement since evaluation. Able to perform SLS > 45 sec as well as single heel raises with min increased pain mostly on plantar surface of foot. Instructed pt is self plantar stretching and massage as well as intrinsic foot strengthening is sitting.   Pt will continue to benefit from skilled physical therapy to address remaining deficits and achieve listed goals.  Continue per POC.     REHAB POTENTIAL: Good   CLINICAL DECISION MAKING: Stable/uncomplicated   EVALUATION COMPLEXITY: Low     GOALS:   SHORT TERM GOALS:   STG Name Target Date Goal status  1 Tianna will be >75% HEP compliant to improve carryover between  sessions and facilitate independent management of condition   Baseline: No HEP 11/05/2021 MET 1/27    LONG TERM GOALS:    LTG Name Target Date Goal status  1 Aubreigh will be able to  complete 10x R single leg heel raises, not limited by pain   Eval: limited by pain 12/10/2021 INITIAL  2 Wilda will improve FOTO score from 44 (baseline) to 62 as a proxy for functional improvement 12/10/2021 INITIAL  3 Lakesha will report >/= 50% decrease in pain from evaluation    Baseline: 6/10 max pain 12/10/2021 INITIAL  4 Mariette will be confident express confidence in advanced HEP in order to self manage condition 12/10/2021 INITIAL  5 Kinzy will be able to stand for >30'' in tandem stance, to show a significant improvement in balance in order to reduce fall risk    Baseline: unable 12/10/2021 INITIAL    PLAN: PT FREQUENCY: 1-2x/week   PT DURATION: 8 weeks (Ending 12/10/2021)   PLANNED INTERVENTIONS: Therapeutic exercises, Therapeutic activity, Neuro Muscular re-education, Gait training, Patient/Family education, Joint mobilization, Dry Needling, Electrical stimulation, Spinal mobilization and/or manipulation, Moist heat, Taping, Vasopneumatic device, Ionotophoresis 28m/ml Dexamethasone, and Manual therapy   PLAN FOR NEXT SESSION: progressive peroneal strengthening, progressive balance, FOTO status soon      JHessie Diener PTA 10/27/21 1:11 PM Phone: [phone]: [phone]

## 2021-10-28 DIAGNOSIS — D485 Neoplasm of uncertain behavior of skin: Secondary | ICD-10-CM | POA: Diagnosis not present

## 2021-10-28 DIAGNOSIS — D225 Melanocytic nevi of trunk: Secondary | ICD-10-CM | POA: Diagnosis not present

## 2021-10-30 ENCOUNTER — Encounter: Payer: Self-pay | Admitting: Physical Therapy

## 2021-10-30 ENCOUNTER — Other Ambulatory Visit: Payer: Self-pay

## 2021-10-30 ENCOUNTER — Ambulatory Visit: Payer: Medicare Other | Attending: Podiatry | Admitting: Physical Therapy

## 2021-10-30 DIAGNOSIS — M25571 Pain in right ankle and joints of right foot: Secondary | ICD-10-CM | POA: Diagnosis not present

## 2021-10-30 DIAGNOSIS — R600 Localized edema: Secondary | ICD-10-CM | POA: Insufficient documentation

## 2021-10-30 DIAGNOSIS — R2681 Unsteadiness on feet: Secondary | ICD-10-CM | POA: Insufficient documentation

## 2021-10-30 NOTE — Therapy (Signed)
OUTPATIENT PHYSICAL THERAPY TREATMENT NOTE   Patient Name: Sharon Benjamin MRN: [id number] DOB:1956/05/16, 66 y.o., female Today's Date: 10/30/2021  PCP: Tonia Ghent, MD REFERRING PROVIDER: Garrel Ridgel, DPM   PT End of Session - 10/30/21 1219     Visit Number 5    Number of Visits 16    Date for PT Re-Evaluation 12/10/21    Authorization Type [employer] MCR- FOTO 6th and 10th    PT Start Time 1217    PT Stop Time 1300    PT Time Calculation (min) 43 min             Past Medical History:  Diagnosis Date   Allergy    Arthritis    Cataract    bi;lateral   Diverticulosis of colon    Gallstones    GERD (gastroesophageal reflux disease)    Hyperlipidemia    Insomnia    with prn ambien use, rare as of 2013   Nephrolithiasis    Thyroid disease    Past Surgical History:  Procedure Laterality Date   Abd sonogram - left renal cyst, GB polyp  03/96   ABDOMINAL HYSTERECTOMY     CATARACT EXTRACTION Bilateral    COLONOSCOPY     CT abd - left renal cyst only  03/96   Left knee arthroscopy  02/2005   TONSILLECTOMY AND ADENOIDECTOMY  Age 65   UGI - hiatal hernia  03/96   Patient Active Problem List   Diagnosis Date Noted   Acne rosacea 09/08/2021   Benign neoplasm of skin of lower limb 09/08/2021   History of neoplasm 09/08/2021   Melanocytic nevi of right upper limb, including shoulder 09/08/2021   Melanocytic nevi of trunk 09/08/2021   Sebaceous cyst 09/08/2021   Gallstones 09/02/2021   Nephrolithiasis 09/02/2021   GERD (gastroesophageal reflux disease) 09/02/2021   Atrophic vaginitis 08/06/2021   Reactive depression (situational) 08/06/2021   Healthcare maintenance 08/13/2020   Shoulder pain 09/19/2019   Knee pain 07/27/2017   Hypothyroidism following radioiodine therapy 05/09/2016   Advance care planning 01/05/2015   Hyperglycemia 01/05/2015   Medicare welcome exam 11/30/2013   Irritability 11/30/2013   Obesity (BMI 30-39.9) 12/07/2012   Fibroids 12/06/2012    Multiple thyroid nodules 08/30/2012   HLD (hyperlipidemia) 07/26/2007   PAP SMEAR, ABNORMAL 07/26/2007   Referring Provider: Garrel Ridgel, DPM   REFERRING DIAG:  Tear of peroneal tendon, right, subsequent encounter [S86.311D], Peroneal tendinitis of right lower extremity [M76.71]    THERAPY DIAG:  Pain in right ankle and joints of right foot  Localized edema  Unsteadiness on feet  PERTINENT HISTORY: None  PRECAUTIONS:   PRECAUTIONS: None   WEIGHT BEARING RESTRICTIONS No  SUBJECTIVE:  Pt reports that using the ASO has been ok, but she does still have some pain during ambulation.  Pain:  Are you having pain? Yes Pain location: lateral R ankle and plantar surface of R foot to first met head NPRS scale: 1/10 current, 2-3/10 pain with activity Aggravating factors: walking, standing  Relieving factors: rest, ice Pain description: constant, sharp, and dull Severity: moderate Irritability: moderate Stage: Chronic Stability: staying the same 24 hour pattern: no clear pattern    OBJECTIVE:              GENERAL OBSERVATION:                     High arches   SENSATION:  Light touch: Appears intact   PALPATION: TTP base of 5th and 1st met head, some mild swelling below lateral malleolus   LE ROM:   MMT Right 10/15/2021 Left 10/15/2021  Hip flexion (L2, L3)      Knee extension (L3)      Knee flexion      Hip abduction      Hip extension      Hip external rotation      Hip internal rotation      Hip adduction      Ankle dorsiflexion (L4) 4 OC 8 OC  Ankle plantarflexion (S1) N N  Ankle inversion 45 45  Ankle eversion 30 25  Great Toe ext (L5)                                     (Blank rows = not tested, * = concordant pain with testing)     LE MMT:   ROM Right 10/15/2021 Left 10/15/2021  Hip flexion      Hip extension      Hip abduction      Hip adduction      Hip internal rotation      Hip external rotation      Knee flexion      Knee  extension      Ankle dorsiflexion 5 5  Ankle plantarflexion 4* 5  Ankle inversion 5 5  Ankle eversion 4* 5    (Blank rows = not tested, score listed is out of 5 possible points.  N = WNL, D = diminished, C = clear for gross weakness with myotome testing, * = concordant pain with testing)    FUNCTIONAL TESTS:    Progressive balance screen:   Feet together: 10'' Semi Tandem: R in rear 10'', L in rear 10'' Tandem: R in rear unable, L in rear unable     GAIT: Comments: slight reduced time in stance R   PATIENT SURVEYS:  FOTO 44% predicted to improved to 62%  Treatment: 10/30/21            Therapeutic Exercise:             -Rec bike L3 x 5 minutes             - BAPS board fwd/bck lat CW/CC - L3  - slant board stretch 45'' x3            - green band inv/eversion/DF - rocker board DF/PF - 2x20              -tandem x 45 sec each  - semi tandem on foam 45''x3 ea  - semi tandem EC 3x45''  - heel/toe raise - 2x20  - step up - fwd and lat - 4'' - 2x10 ea  - lateral walking with RTB - 4 laps   Treatment: 10/23/21            Therapeutic Exercise:             -Rec bike L3 x 5 minutes             - BAPS board fwd/bck lat CW/CC - L3  - slant board stretch 45'' x3            -seated heel raise, toe raise (NT)            - red band inv/eversion/DF - rocker board DF/PF - 2x20             -  semi tandem EC            -tandem x 30 sec each  -tandem on foam 45''x2 ea     Treatment: 10/19/21            Therapeutic Exercise:             -Rec bike L2 x 5 minutes             -Seated towel scrunches            -seated towel slides for IR/ER            -seated heel raise, toe raise             - red band 4 way ankle (except black band for PF)            -semi tandem EC            -tandem x 30 sec each  Initial TREATMENT: Creating, reviewing, and completing below HEP     PATIENT EDUCATION:  Updated HEP     HOME EXERCISE PROGRAM: Access Code: [email or web address] URL:  [email or web address] Date: 10/19/2021 Prepared by: Hessie Diener  Exercises Seated Toe Towel Scrunches - 2 x daily - 7 x weekly - 3 sets - 10 reps Long Sitting Ankle Plantar Flexion with Resistance - 2 x daily - 7 x weekly - 3 sets - 10 reps Ankle Inversion Eversion Towel Slide - 2 x daily - 7 x weekly - 3 sets - 10 reps Seated Ankle Eversion with Resistance - 1 x daily - 7 x weekly - 2-3 sets - 10 reps Seated Ankle Dorsiflexion with Resistance - 1 x daily - 7 x weekly - 2-3 sets - 10 reps Seated Ankle Inversion with Resistance and Legs Crossed - 1 x daily - 7 x weekly - 2-3 sets - 10 reps Tandem Stance with Support - 1 x daily - 7 x weekly - 1 sets - 2 reps - 30 hold        ASSESSMENT:   CLINICAL IMPRESSION: Sharon Benjamin is progressing well with therapy.  Pt reports a mild increase in pain following therapy.  Today we concentrated on hip strengthening, ankle strengthening, and balance/proprioception.  Pt with reduced baseline and max pain with improved balance today.  Will continue to gently progress strength and balance.  Pt will continue to benefit from skilled physical therapy to address remaining deficits and achieve listed goals.  Continue per POC.     REHAB POTENTIAL: Good   CLINICAL DECISION MAKING: Stable/uncomplicated   EVALUATION COMPLEXITY: Low     GOALS:   SHORT TERM GOALS:   STG Name Target Date Goal status  1 Sharon Benjamin will be >75% HEP compliant to improve carryover between sessions and facilitate independent management of condition   Baseline: No HEP 11/05/2021 MET 1/27    LONG TERM GOALS:    LTG Name Target Date Goal status  1 Sharon Benjamin will be able to complete 10x R single leg heel raises, not limited by pain   Eval: limited by pain 12/10/2021 INITIAL  2 Sharon Benjamin will improve FOTO score from 44 (baseline) to 62 as a proxy for functional improvement 12/10/2021 INITIAL  3 Sharon Benjamin will report >/= 50% decrease in pain from evaluation    Baseline: 6/10 max  pain 12/10/2021 INITIAL  4 Sharon Benjamin will be confident express confidence in advanced HEP in order to self manage condition 12/10/2021 INITIAL  5 Sharon Benjamin will be able to stand for >30'' in tandem stance,  to show a significant improvement in balance in order to reduce fall risk    Baseline: unable 12/10/2021 INITIAL    PLAN: PT FREQUENCY: 1-2x/week   PT DURATION: 8 weeks (Ending 12/10/2021)   PLANNED INTERVENTIONS: Therapeutic exercises, Therapeutic activity, Neuro Muscular re-education, Gait training, Patient/Family education, Joint mobilization, Dry Needling, Electrical stimulation, Spinal mobilization and/or manipulation, Moist heat, Taping, Vasopneumatic device, Ionotophoresis 67m/ml Dexamethasone, and Manual therapy   PLAN FOR NEXT SESSION: progressive peroneal strengthening, progressive balance      JHessie Diener PTA 10/30/21 12:20 PM Phone: [phone]: [phone]

## 2021-11-03 ENCOUNTER — Ambulatory Visit: Payer: Medicare Other | Admitting: Physical Therapy

## 2021-11-03 ENCOUNTER — Other Ambulatory Visit: Payer: Self-pay

## 2021-11-03 ENCOUNTER — Encounter: Payer: Self-pay | Admitting: Physical Therapy

## 2021-11-03 DIAGNOSIS — M25571 Pain in right ankle and joints of right foot: Secondary | ICD-10-CM | POA: Diagnosis not present

## 2021-11-03 DIAGNOSIS — R2681 Unsteadiness on feet: Secondary | ICD-10-CM | POA: Diagnosis not present

## 2021-11-03 DIAGNOSIS — R600 Localized edema: Secondary | ICD-10-CM | POA: Diagnosis not present

## 2021-11-03 NOTE — Therapy (Signed)
OUTPATIENT PHYSICAL THERAPY TREATMENT NOTE   Patient Name: Sharon Benjamin MRN: [id number] DOB:04-Mar-1956, 66 y.o., female Today's Date: 11/03/2021  PCP: Tonia Ghent, MD REFERRING PROVIDER: Tonia Ghent, MD   PT End of Session - 11/03/21 1213     Visit Number 6    Number of Visits 16    Date for PT Re-Evaluation 12/10/21    Authorization Type [employer] MCR- FOTO 6th and 10th    PT Start Time 1215    PT Stop Time 1258    PT Time Calculation (min) 43 min             Past Medical History:  Diagnosis Date   Allergy    Arthritis    Cataract    bi;lateral   Diverticulosis of colon    Gallstones    GERD (gastroesophageal reflux disease)    Hyperlipidemia    Insomnia    with prn ambien use, rare as of 2013   Nephrolithiasis    Thyroid disease    Past Surgical History:  Procedure Laterality Date   Abd sonogram - left renal cyst, GB polyp  03/96   ABDOMINAL HYSTERECTOMY     CATARACT EXTRACTION Bilateral    COLONOSCOPY     CT abd - left renal cyst only  03/96   Left knee arthroscopy  02/2005   TONSILLECTOMY AND ADENOIDECTOMY  Age 27   UGI - hiatal hernia  03/96   Patient Active Problem List   Diagnosis Date Noted   Acne rosacea 09/08/2021   Benign neoplasm of skin of lower limb 09/08/2021   History of neoplasm 09/08/2021   Melanocytic nevi of right upper limb, including shoulder 09/08/2021   Melanocytic nevi of trunk 09/08/2021   Sebaceous cyst 09/08/2021   Gallstones 09/02/2021   Nephrolithiasis 09/02/2021   GERD (gastroesophageal reflux disease) 09/02/2021   Atrophic vaginitis 08/06/2021   Reactive depression (situational) 08/06/2021   Healthcare maintenance 08/13/2020   Shoulder pain 09/19/2019   Knee pain 07/27/2017   Hypothyroidism following radioiodine therapy 05/09/2016   Advance care planning 01/05/2015   Hyperglycemia 01/05/2015   Medicare welcome exam 11/30/2013   Irritability 11/30/2013   Obesity (BMI 30-39.9) 12/07/2012   Fibroids 12/06/2012    Multiple thyroid nodules 08/30/2012   HLD (hyperlipidemia) 07/26/2007   PAP SMEAR, ABNORMAL 07/26/2007   Referring Provider: Garrel Ridgel, DPM   REFERRING DIAG:  Tear of peroneal tendon, right, subsequent encounter [S86.311D], Peroneal tendinitis of right lower extremity [M76.71]    THERAPY DIAG:  Pain in right ankle and joints of right foot  Localized edema  Unsteadiness on feet  PERTINENT HISTORY: None  PRECAUTIONS:   PRECAUTIONS: None   WEIGHT BEARING RESTRICTIONS No  SUBJECTIVE:  Pt reports that she is doing well overall.  She feels and improvement in her ankle.  Pain:  Are you having pain? Yes Pain location: lateral R ankle and plantar surface of R foot to first met head NPRS scale: 0/10 current, 2/10 pain with activity Aggravating factors: walking, standing  Relieving factors: rest, ice Pain description: constant, sharp, and dull Severity: moderate Irritability: moderate Stage: Chronic Stability: staying the same 24 hour pattern: no clear pattern    OBJECTIVE:              GENERAL OBSERVATION:                     High arches   SENSATION:          Light touch:  Appears intact   PALPATION: TTP base of 5th and 1st met head, some mild swelling below lateral malleolus   LE ROM:   MMT Right 10/15/2021 Left 10/15/2021  Hip flexion (L2, L3)      Knee extension (L3)      Knee flexion      Hip abduction      Hip extension      Hip external rotation      Hip internal rotation      Hip adduction      Ankle dorsiflexion (L4) 4 OC 8 OC  Ankle plantarflexion (S1) N N  Ankle inversion 45 45  Ankle eversion 30 25  Great Toe ext (L5)                                     (Blank rows = not tested, * = concordant pain with testing)     LE MMT:   ROM Right 10/15/2021 Left 10/15/2021  Hip flexion      Hip extension      Hip abduction      Hip adduction      Hip internal rotation      Hip external rotation      Knee flexion      Knee extension       Ankle dorsiflexion 5 5  Ankle plantarflexion 4* 5  Ankle inversion 5 5  Ankle eversion 4* 5    (Blank rows = not tested, score listed is out of 5 possible points.  N = WNL, D = diminished, C = clear for gross weakness with myotome testing, * = concordant pain with testing)    FUNCTIONAL TESTS:    Progressive balance screen:   Feet together: 10'' Semi Tandem: R in rear 10'', L in rear 10'' Tandem: R in rear unable, L in rear unable     GAIT: Comments: slight reduced time in stance R   PATIENT SURVEYS:  FOTO 44% predicted to improved to 62%  Treatment: 2/7/231            Therapeutic Exercise:             -Rec bike L4 x 5 minutes   - slant board stretch 45'' x3 - wooden rocker board DF/PF - 2x20   - feet together EC on foam 45''x3  - tandem on foam 45''x3 ea  - semi tandem EC 3x45''  - heel/toe raise on 2'' step - 2x20  - step up - fwd and lat - 4'' - 2x10 ea  - R hip hike on step - 2x20  - lateral walking with RTB - 4 laps  Treatment: 10/30/21            Therapeutic Exercise:             -Rec bike L3 x 5 minutes             - BAPS board fwd/bck lat CW/CC - L3  - slant board stretch 45'' x3            - green band inv/eversion/DF - rocker board DF/PF - 2x20              -tandem x 45 sec each  - semi tandem on foam 45''x3 ea  - semi tandem EC 3x45''  - heel/toe raise - 2x20  - step up - fwd and lat - 4'' - 2x10 ea  -  lateral walking with RTB - 4 laps   Treatment: 10/23/21            Therapeutic Exercise:             -Rec bike L3 x 5 minutes             - BAPS board fwd/bck lat CW/CC - L3  - slant board stretch 45'' x3            -seated heel raise, toe raise (NT)            - red band inv/eversion/DF - wooden rocker board DF/PF - 2x20             -semi tandem EC            -tandem x 30 sec each  -tandem on foam 45''x2 ea     Treatment: 10/19/21            Therapeutic Exercise:             -Rec bike L2 x 5 minutes             -Seated towel scrunches             -seated towel slides for IR/ER            -seated heel raise, toe raise             - red band 4 way ankle (except black band for PF)            -semi tandem EC - 45''x3            -single leg stance -   Initial TREATMENT: Creating, reviewing, and completing below HEP     PATIENT EDUCATION:  Updated HEP     HOME EXERCISE PROGRAM: Access Code: [email or web address] URL: [email or web address] Date: 10/19/2021 Prepared by: Hessie Diener  Exercises Seated Toe Towel Scrunches - 2 x daily - 7 x weekly - 3 sets - 10 reps Long Sitting Ankle Plantar Flexion with Resistance - 2 x daily - 7 x weekly - 3 sets - 10 reps Ankle Inversion Eversion Towel Slide - 2 x daily - 7 x weekly - 3 sets - 10 reps Seated Ankle Eversion with Resistance - 1 x daily - 7 x weekly - 2-3 sets - 10 reps Seated Ankle Dorsiflexion with Resistance - 1 x daily - 7 x weekly - 2-3 sets - 10 reps Seated Ankle Inversion with Resistance and Legs Crossed - 1 x daily - 7 x weekly - 2-3 sets - 10 reps Tandem Stance with Support - 1 x daily - 7 x weekly - 1 sets - 2 reps - 30 hold        ASSESSMENT:   CLINICAL IMPRESSION: Cait is progressing well with therapy.  Pt reports a mild increase in pain following therapy.  Today we concentrated on ankle strengthening and balance/proprioception.  Pt with improved FOTO score to 59 and positive progression with strength, balance, and pain.  Pt will continue to benefit from skilled physical therapy to address remaining deficits and achieve listed goals.  Continue per POC.        GOALS:   SHORT TERM GOALS:   STG Name Target Date Goal status  1 Magali will be >75% HEP compliant to improve carryover between sessions and facilitate independent management of condition   Baseline: No HEP 11/05/2021 MET 1/27    LONG TERM GOALS:    LTG Name Target Date Goal status  1 Jana will be able to complete 10x R single leg heel raises, not limited by pain   Eval: limited by pain  12/10/2021 INITIAL  2 Fartun will improve FOTO score from 44 (baseline) to 62 as a proxy for functional improvement  2/7: 59 12/10/2021 ongoing  3 Charvi will report >/= 50% decrease in pain from evaluation    Baseline: 6/10 max pain 12/10/2021 INITIAL  4 Savaya will be confident express confidence in advanced HEP in order to self manage condition 12/10/2021 INITIAL  5 Renetta will be able to stand for >30'' in tandem stance, to show a significant improvement in balance in order to reduce fall risk    Baseline: unable 12/10/2021 INITIAL    PLAN: PT FREQUENCY: 1-2x/week   PT DURATION: 8 weeks (Ending 12/10/2021)   PLANNED INTERVENTIONS: Therapeutic exercises, Therapeutic activity, Neuro Muscular re-education, Gait training, Patient/Family education, Joint mobilization, Dry Needling, Electrical stimulation, Spinal mobilization and/or manipulation, Moist heat, Taping, Vasopneumatic device, Ionotophoresis 41m/ml Dexamethasone, and Manual therapy   PLAN FOR NEXT SESSION: progressive peroneal strengthening, progressive balance      KMathis DadPT

## 2021-11-05 ENCOUNTER — Other Ambulatory Visit: Payer: Self-pay

## 2021-11-05 ENCOUNTER — Encounter: Payer: Self-pay | Admitting: Physical Therapy

## 2021-11-05 ENCOUNTER — Ambulatory Visit: Payer: Medicare Other | Admitting: Physical Therapy

## 2021-11-05 DIAGNOSIS — R2681 Unsteadiness on feet: Secondary | ICD-10-CM | POA: Diagnosis not present

## 2021-11-05 DIAGNOSIS — M25571 Pain in right ankle and joints of right foot: Secondary | ICD-10-CM

## 2021-11-05 DIAGNOSIS — R600 Localized edema: Secondary | ICD-10-CM | POA: Diagnosis not present

## 2021-11-05 NOTE — Therapy (Signed)
OUTPATIENT PHYSICAL THERAPY TREATMENT NOTE   Patient Name: Sharon Benjamin MRN: [id number] DOB:1956-06-25, 66 y.o., female Today's Date: 11/05/2021  PCP: Tonia Ghent, MD REFERRING PROVIDER: Garrel Ridgel, DPM   PT End of Session - 11/05/21 [phone]     Visit Number 7    Number of Visits 16    Date for PT Re-Evaluation 12/10/21    Authorization Type [employer] MCR- FOTO 6th and 10th    PT Start Time 1000    PT Stop Time 1042    PT Time Calculation (min) 42 min             Past Medical History:  Diagnosis Date   Allergy    Arthritis    Cataract    bi;lateral   Diverticulosis of colon    Gallstones    GERD (gastroesophageal reflux disease)    Hyperlipidemia    Insomnia    with prn ambien use, rare as of 2013   Nephrolithiasis    Thyroid disease    Past Surgical History:  Procedure Laterality Date   Abd sonogram - left renal cyst, GB polyp  03/96   ABDOMINAL HYSTERECTOMY     CATARACT EXTRACTION Bilateral    COLONOSCOPY     CT abd - left renal cyst only  03/96   Left knee arthroscopy  02/2005   TONSILLECTOMY AND ADENOIDECTOMY  Age 74   UGI - hiatal hernia  03/96   Patient Active Problem List   Diagnosis Date Noted   Acne rosacea 09/08/2021   Benign neoplasm of skin of lower limb 09/08/2021   History of neoplasm 09/08/2021   Melanocytic nevi of right upper limb, including shoulder 09/08/2021   Melanocytic nevi of trunk 09/08/2021   Sebaceous cyst 09/08/2021   Gallstones 09/02/2021   Nephrolithiasis 09/02/2021   GERD (gastroesophageal reflux disease) 09/02/2021   Atrophic vaginitis 08/06/2021   Reactive depression (situational) 08/06/2021   Healthcare maintenance 08/13/2020   Shoulder pain 09/19/2019   Knee pain 07/27/2017   Hypothyroidism following radioiodine therapy 05/09/2016   Advance care planning 01/05/2015   Hyperglycemia 01/05/2015   Medicare welcome exam 11/30/2013   Irritability 11/30/2013   Obesity (BMI 30-39.9) 12/07/2012   Fibroids 12/06/2012    Multiple thyroid nodules 08/30/2012   HLD (hyperlipidemia) 07/26/2007   PAP SMEAR, ABNORMAL 07/26/2007   Referring Provider: Garrel Ridgel, DPM   REFERRING DIAG:  Tear of peroneal tendon, right, subsequent encounter [S86.311D], Peroneal tendinitis of right lower extremity [M76.71]    THERAPY DIAG:  Pain in right ankle and joints of right foot  Localized edema  Unsteadiness on feet  PERTINENT HISTORY: None  PRECAUTIONS:   PRECAUTIONS: None   WEIGHT BEARING RESTRICTIONS No  SUBJECTIVE:  Pt reports hat her calf was sore after last visit.  She feels her ankle is doing well.  Pain:  Are you having pain? Yes Pain location: lateral R ankle and plantar surface of R foot to first met head NPRS scale: 0/10 current, 2/10 pain with activity Aggravating factors: walking, standing  Relieving factors: rest, ice Pain description: constant, sharp, and dull Severity: moderate Irritability: moderate Stage: Chronic Stability: staying the same 24 hour pattern: no clear pattern    OBJECTIVE:              GENERAL OBSERVATION:                     High arches   SENSATION:  Light touch: Appears intact   PALPATION: TTP base of 5th and 1st met head, some mild swelling below lateral malleolus   LE ROM:   MMT Right 10/15/2021 Left 10/15/2021  Hip flexion (L2, L3)      Knee extension (L3)      Knee flexion      Hip abduction      Hip extension      Hip external rotation      Hip internal rotation      Hip adduction      Ankle dorsiflexion (L4) 4 OC 8 OC  Ankle plantarflexion (S1) N N  Ankle inversion 45 45  Ankle eversion 30 25  Great Toe ext (L5)                                     (Blank rows = not tested, * = concordant pain with testing)     LE MMT:   ROM Right 10/15/2021 Left 10/15/2021  Hip flexion      Hip extension      Hip abduction      Hip adduction      Hip internal rotation      Hip external rotation      Knee flexion      Knee extension       Ankle dorsiflexion 5 5  Ankle plantarflexion 4* 5  Ankle inversion 5 5  Ankle eversion 4* 5    (Blank rows = not tested, score listed is out of 5 possible points.  N = WNL, D = diminished, C = clear for gross weakness with myotome testing, * = concordant pain with testing)    FUNCTIONAL TESTS:    Progressive balance screen:   Feet together: 10'' Semi Tandem: R in rear 10'', L in rear 10'' Tandem: R in rear unable, L in rear unable     GAIT: Comments: slight reduced time in stance R   PATIENT SURVEYS:  FOTO 44% predicted to improved to 62%  Treatment: 2/9/231            Therapeutic Exercise:             -Rec bike L4 x 5 minutes   - slant board stretch 45'' x3 - wooden rocker board DF/PF - 2x20   - feet together EC on foam 45''x3  - SLS - 15'' x5  - heel/toe raise on 2'' step - 2x20  - step up - fwd and lat - 6'' - 2x10 ea  - SL RDL to chair (reach) - 3x10   - R hip hike on step - 2x20  - lateral walking with RTB - 4 laps  Treatment: 2/7/231            Therapeutic Exercise:             -Rec bike L4 x 5 minutes   - slant board stretch 45'' x3 - wooden rocker board DF/PF - 2x20   - feet together EC on foam 45''x3  - tandem on foam 45''x3 ea  - semi tandem EC 3x45''  - heel/toe raise on 2'' step - 2x20  - step up - fwd and lat - 4'' - 2x10 ea  - R hip hike on step - 2x20  - lateral walking with RTB - 4 laps  Treatment: 10/30/21            Therapeutic Exercise:             -  Rec bike L3 x 5 minutes             - BAPS board fwd/bck lat CW/CC - L3  - slant board stretch 45'' x3            - green band inv/eversion/DF - rocker board DF/PF - 2x20              -tandem x 45 sec each  - semi tandem on foam 45''x3 ea  - semi tandem EC 3x45''  - heel/toe raise - 2x20  - step up - fwd and lat - 4'' - 2x10 ea  - lateral walking with RTB - 4 laps   Treatment: 10/23/21            Therapeutic Exercise:             -Rec bike L3 x 5 minutes             - BAPS board fwd/bck  lat CW/CC - L3  - slant board stretch 45'' x3            -seated heel raise, toe raise (NT)            - red band inv/eversion/DF - wooden rocker board DF/PF - 2x20             -semi tandem EC            -tandem x 30 sec each  -tandem on foam 45''x2 ea     Treatment: 10/19/21            Therapeutic Exercise:             -Rec bike L2 x 5 minutes             -Seated towel scrunches            -seated towel slides for IR/ER            -seated heel raise, toe raise             - red band 4 way ankle (except black band for PF)            -semi tandem EC - 45''x3            -single leg stance -   Initial TREATMENT: Creating, reviewing, and completing below HEP     PATIENT EDUCATION:  Updated HEP     HOME EXERCISE PROGRAM: Access Code: [email or web address] URL: [email or web address] Date: 10/19/2021 Prepared by: Hessie Diener  Exercises Seated Toe Towel Scrunches - 2 x daily - 7 x weekly - 3 sets - 10 reps Long Sitting Ankle Plantar Flexion with Resistance - 2 x daily - 7 x weekly - 3 sets - 10 reps Ankle Inversion Eversion Towel Slide - 2 x daily - 7 x weekly - 3 sets - 10 reps Seated Ankle Eversion with Resistance - 1 x daily - 7 x weekly - 2-3 sets - 10 reps Seated Ankle Dorsiflexion with Resistance - 1 x daily - 7 x weekly - 2-3 sets - 10 reps Seated Ankle Inversion with Resistance and Legs Crossed - 1 x daily - 7 x weekly - 2-3 sets - 10 reps Tandem Stance with Support - 1 x daily - 7 x weekly - 1 sets - 2 reps - 30 hold        ASSESSMENT:   CLINICAL IMPRESSION: Shaquoya is progressing well with therapy.  Pt reports a mild increase in pain following therapy.  Today we  concentrated on hip strengthening, ankle strengthening, and balance/proprioception.  Pt able to progress to SLS balance today, showing significant improvement in balance.  Pt will continue to benefit from skilled physical therapy to address remaining deficits and achieve listed goals.  Continue per POC.         GOALS:   SHORT TERM GOALS:   STG Name Target Date Goal status  1 Juanetta will be >75% HEP compliant to improve carryover between sessions and facilitate independent management of condition   Baseline: No HEP 11/05/2021 MET 1/27    LONG TERM GOALS:    LTG Name Target Date Goal status  1 Marketia will be able to complete 10x R single leg heel raises, not limited by pain   Eval: limited by pain 12/10/2021 INITIAL  2 Manjot will improve FOTO score from 44 (baseline) to 62 as a proxy for functional improvement  2/7: 59 12/10/2021 ongoing  3 Valleri will report >/= 50% decrease in pain from evaluation    Baseline: 6/10 max pain 12/10/2021 INITIAL  4 Tanikka will be confident express confidence in advanced HEP in order to self manage condition 12/10/2021 INITIAL  5 Ajanee will be able to stand for >30'' in tandem stance, to show a significant improvement in balance in order to reduce fall risk    Baseline: unable 12/10/2021 INITIAL    PLAN: PT FREQUENCY: 1-2x/week   PT DURATION: 8 weeks (Ending 12/10/2021)   PLANNED INTERVENTIONS: Therapeutic exercises, Therapeutic activity, Neuro Muscular re-education, Gait training, Patient/Family education, Joint mobilization, Dry Needling, Electrical stimulation, Spinal mobilization and/or manipulation, Moist heat, Taping, Vasopneumatic device, Ionotophoresis 28m/ml Dexamethasone, and Manual therapy   PLAN FOR NEXT SESSION: progressive peroneal strengthening, progressive balance    KMathis DadPT 11:33 AM, 11/05/2021

## 2021-11-10 ENCOUNTER — Ambulatory Visit: Payer: Medicare Other | Admitting: Physical Therapy

## 2021-11-10 ENCOUNTER — Other Ambulatory Visit: Payer: Self-pay

## 2021-11-10 ENCOUNTER — Encounter: Payer: Self-pay | Admitting: Physical Therapy

## 2021-11-10 DIAGNOSIS — M25571 Pain in right ankle and joints of right foot: Secondary | ICD-10-CM | POA: Diagnosis not present

## 2021-11-10 DIAGNOSIS — R600 Localized edema: Secondary | ICD-10-CM

## 2021-11-10 DIAGNOSIS — R2681 Unsteadiness on feet: Secondary | ICD-10-CM | POA: Diagnosis not present

## 2021-11-10 NOTE — Therapy (Signed)
OUTPATIENT PHYSICAL THERAPY TREATMENT NOTE   Patient Name: Sharon Benjamin MRN: [id number] DOB:11-12-1955, 66 y.o., female Today's Date: 11/10/2021  PCP: Tonia Ghent, MD REFERRING PROVIDER: Tonia Ghent, MD   PT End of Session - 11/10/21 1017     Visit Number 8    Number of Visits 16    Date for PT Re-Evaluation 12/10/21    Authorization Type [employer] MCR- FOTO 6th and 10th    PT Start Time 1015    PT Stop Time 1055    PT Time Calculation (min) 40 min             Past Medical History:  Diagnosis Date   Allergy    Arthritis    Cataract    bi;lateral   Diverticulosis of colon    Gallstones    GERD (gastroesophageal reflux disease)    Hyperlipidemia    Insomnia    with prn ambien use, rare as of 2013   Nephrolithiasis    Thyroid disease    Past Surgical History:  Procedure Laterality Date   Abd sonogram - left renal cyst, GB polyp  03/96   ABDOMINAL HYSTERECTOMY     CATARACT EXTRACTION Bilateral    COLONOSCOPY     CT abd - left renal cyst only  03/96   Left knee arthroscopy  02/2005   TONSILLECTOMY AND ADENOIDECTOMY  Age 72   UGI - hiatal hernia  03/96   Patient Active Problem List   Diagnosis Date Noted   Acne rosacea 09/08/2021   Benign neoplasm of skin of lower limb 09/08/2021   History of neoplasm 09/08/2021   Melanocytic nevi of right upper limb, including shoulder 09/08/2021   Melanocytic nevi of trunk 09/08/2021   Sebaceous cyst 09/08/2021   Gallstones 09/02/2021   Nephrolithiasis 09/02/2021   GERD (gastroesophageal reflux disease) 09/02/2021   Atrophic vaginitis 08/06/2021   Reactive depression (situational) 08/06/2021   Healthcare maintenance 08/13/2020   Shoulder pain 09/19/2019   Knee pain 07/27/2017   Hypothyroidism following radioiodine therapy 05/09/2016   Advance care planning 01/05/2015   Hyperglycemia 01/05/2015   Medicare welcome exam 11/30/2013   Irritability 11/30/2013   Obesity (BMI 30-39.9) 12/07/2012   Fibroids  12/06/2012   Multiple thyroid nodules 08/30/2012   HLD (hyperlipidemia) 07/26/2007   PAP SMEAR, ABNORMAL 07/26/2007   Referring Provider: Garrel Ridgel, DPM   REFERRING DIAG:  Tear of peroneal tendon, right, subsequent encounter [S86.311D], Peroneal tendinitis of right lower extremity [M76.71]    THERAPY DIAG:  Pain in right ankle and joints of right foot  Localized edema  Unsteadiness on feet  PERTINENT HISTORY: None  PRECAUTIONS:   PRECAUTIONS: None   WEIGHT BEARING RESTRICTIONS No  SUBJECTIVE:  Pt reports hat her calf was sore after last visit.  She feels her ankle is doing well.  Pain:  Are you having pain? Yes Pain location: lateral R ankle and plantar surface of R foot to first met head NPRS scale: 0/10 current, 2/10 pain with activity Aggravating factors: walking, standing  Relieving factors: rest, ice Pain description: constant, sharp, and dull Severity: moderate Irritability: moderate Stage: Chronic Stability: staying the same 24 hour pattern: no clear pattern    OBJECTIVE:              GENERAL OBSERVATION:                     High arches   SENSATION:  Light touch: Appears intact   PALPATION: TTP base of 5th and 1st met head, some mild swelling below lateral malleolus   LE ROM:   MMT Right 10/15/2021 Left 10/15/2021  Hip flexion (L2, L3)      Knee extension (L3)      Knee flexion      Hip abduction      Hip extension      Hip external rotation      Hip internal rotation      Hip adduction      Ankle dorsiflexion (L4) 4 OC 8 OC  Ankle plantarflexion (S1) N N  Ankle inversion 45 45  Ankle eversion 30 25  Great Toe ext (L5)                                     (Blank rows = not tested, * = concordant pain with testing)     LE MMT:   ROM Right 10/15/2021 Left 10/15/2021  Hip flexion      Hip extension      Hip abduction      Hip adduction      Hip internal rotation      Hip external rotation      Knee flexion      Knee  extension      Ankle dorsiflexion 5 5  Ankle plantarflexion 4* 5  Ankle inversion 5 5  Ankle eversion 4* 5    (Blank rows = not tested, score listed is out of 5 possible points.  N = WNL, D = diminished, C = clear for gross weakness with myotome testing, * = concordant pain with testing)    FUNCTIONAL TESTS:    Progressive balance screen:   Feet together: 10'' Semi Tandem: R in rear 10'', L in rear 10'' Tandem: R in rear unable, L in rear unable     GAIT: Comments: slight reduced time in stance R   PATIENT SURVEYS:  FOTO 44% predicted to improved to 62%  Treatment: 2/14/231            Therapeutic Exercise:             -Rec bike L4 x 5 minutes   - slant board stretch 45'' x3 - wooden rocker board DF/PF - 2x20   - SLS - 35'' x1  - heel/toe raise on 2'' step - 2x20  - step up - fwd and lat - 6'' - 2x10 ea  - tandem stance 60 sec  with each foot forward  -tandem on foam 60''x1 each  - R hip hike on step - 2x20  - lateral walking with RTB - 4 laps  -hip extension RTB 10 x 2 each   --sit-stand x 10   Treatment: 2/9/231            Therapeutic Exercise:             -Rec bike L4 x 5 minutes   - slant board stretch 45'' x3 - wooden rocker board DF/PF - 2x20   - feet together EC on foam 45''x3  - SLS - 15'' x5  - heel/toe raise on 2'' step - 2x20  - step up - fwd and lat - 6'' - 2x10 ea  - SL RDL to chair (reach) - 3x10   - R hip hike on step - 2x20  - lateral walking with RTB - 4 laps  Treatment: 2/7/231            Therapeutic Exercise:             -Rec bike L4 x 5 minutes   - slant board stretch 45'' x3 - wooden rocker board DF/PF - 2x20   - feet together EC on foam 45''x3  - tandem on foam 45''x3 ea  - semi tandem EC 3x45''  - heel/toe raise on 2'' step - 2x20  - step up - fwd and lat - 4'' - 2x10 ea  - R hip hike on step - 2x20  - lateral walking with RTB - 4 laps  Treatment: 10/30/21            Therapeutic Exercise:             -Rec bike L3 x 5 minutes              - BAPS board fwd/bck lat CW/CC - L3  - slant board stretch 45'' x3            - green band inv/eversion/DF - rocker board DF/PF - 2x20              -tandem x 45 sec each  - semi tandem on foam 45''x3 ea  - semi tandem EC 3x45''  - heel/toe raise - 2x20  - step up - fwd and lat - 4'' - 2x10 ea  - lateral walking with RTB - 4 laps     PATIENT EDUCATION:  Updated HEP     HOME EXERCISE PROGRAM: Access Code: [email or web address] URL: [email or web address] Date: 10/19/2021 Prepared by: Hessie Diener  Exercises Seated Toe Towel Scrunches - 2 x daily - 7 x weekly - 3 sets - 10 reps Long Sitting Ankle Plantar Flexion with Resistance - 2 x daily - 7 x weekly - 3 sets - 10 reps Ankle Inversion Eversion Towel Slide - 2 x daily - 7 x weekly - 3 sets - 10 reps Seated Ankle Eversion with Resistance - 1 x daily - 7 x weekly - 2-3 sets - 10 reps Seated Ankle Dorsiflexion with Resistance - 1 x daily - 7 x weekly - 2-3 sets - 10 reps Seated Ankle Inversion with Resistance and Legs Crossed - 1 x daily - 7 x weekly - 2-3 sets - 10 reps Tandem Stance with Support - 1 x daily - 7 x weekly - 1 sets - 2 reps - 30 hold        ASSESSMENT:   CLINICAL IMPRESSION: Irasema is progressing well with therapy.  Pt reports a mild increase in pain following therapy.  Today we concentrated on hip strengthening, ankle strengthening, and balance/proprioception.  Pt able to progress  SLS balance today, improved to 35 sec. Tandem stance improved to 60 sec. She has met LTG #5 and is progressing toward remaining goals.   Pt will continue to benefit from skilled physical therapy to address remaining deficits and achieve listed goals.  Continue per POC.        GOALS:   SHORT TERM GOALS:   STG Name Target Date Goal status  1 Wladyslawa will be >75% HEP compliant to improve carryover between sessions and facilitate independent management of condition   Baseline: No HEP 11/05/2021 MET 10/23/21    LONG TERM  GOALS:    LTG Name Target Date Goal status  1 Gerilyn will be able to complete 10x R single leg heel raises, not limited by pain   Eval: limited by  pain 12/10/2021 Ongoing  11/10/21  2 Melda will improve FOTO score from 44 (baseline) to 62 as a proxy for functional improvement  2/7: 59 12/10/2021 Ongoing 11/10/21  3 Gianni will report >/= 50% decrease in pain from evaluation    Baseline: 6/10 max pain; 11/10/21: 3/10 max 12/10/2021 Ongoing 11/10/21  4 Daielle will be confident express confidence in advanced HEP in order to self manage condition 12/10/2021 Ongoing  11/10/21  5 Desaree will be able to stand for >30'' in tandem stance, to show a significant improvement in balance in order to reduce fall risk    Baseline: unable; 11/10/21: 60 sec  12/10/2021 Met 11/10/21    PLAN: PT FREQUENCY: 1-2x/week   PT DURATION: 8 weeks (Ending 12/10/2021)   PLANNED INTERVENTIONS: Therapeutic exercises, Therapeutic activity, Neuro Muscular re-education, Gait training, Patient/Family education, Joint mobilization, Dry Needling, Electrical stimulation, Spinal mobilization and/or manipulation, Moist heat, Taping, Vasopneumatic device, Ionotophoresis 70m/ml Dexamethasone, and Manual therapy   PLAN FOR NEXT SESSION: progressive peroneal strengthening, progressive balance    JDeon PillingPTA 10:54 AM, 11/10/2021

## 2021-11-12 ENCOUNTER — Encounter: Payer: Self-pay | Admitting: Physical Therapy

## 2021-11-12 ENCOUNTER — Ambulatory Visit: Payer: Medicare Other | Admitting: Physical Therapy

## 2021-11-12 ENCOUNTER — Other Ambulatory Visit: Payer: Self-pay

## 2021-11-12 DIAGNOSIS — M25571 Pain in right ankle and joints of right foot: Secondary | ICD-10-CM | POA: Diagnosis not present

## 2021-11-12 DIAGNOSIS — R600 Localized edema: Secondary | ICD-10-CM

## 2021-11-12 DIAGNOSIS — R2681 Unsteadiness on feet: Secondary | ICD-10-CM | POA: Diagnosis not present

## 2021-11-12 NOTE — Therapy (Signed)
PHYSICAL THERAPY DISCHARGE SUMMARY  Visits from Start of Care: 9  Current functional level related to goals / functional outcomes: See assessment/goals   Remaining deficits: See assessment/goals   Education / Equipment: HEP and D/C plans  Patient agrees to discharge. Patient goals were partially met. Patient is being discharged due to being pleased with the current functional level.   Patient Name: Sharon Benjamin MRN: [id number] DOB:1956-08-04, 66 y.o., female Today's Date: 11/12/2021  PCP: Tonia Ghent, MD REFERRING PROVIDER: Garrel Ridgel, DPM   PT End of Session - 11/12/21 1000     Visit Number 9    Number of Visits 16    Date for PT Re-Evaluation 12/10/21    Authorization Type [employer] MCR- FOTO 6th and 10th    PT Start Time 1000    PT Stop Time 1040    PT Time Calculation (min) 40 min             Past Medical History:  Diagnosis Date   Allergy    Arthritis    Cataract    bi;lateral   Diverticulosis of colon    Gallstones    GERD (gastroesophageal reflux disease)    Hyperlipidemia    Insomnia    with prn ambien use, rare as of 2013   Nephrolithiasis    Thyroid disease    Past Surgical History:  Procedure Laterality Date   Abd sonogram - left renal cyst, GB polyp  03/96   ABDOMINAL HYSTERECTOMY     CATARACT EXTRACTION Bilateral    COLONOSCOPY     CT abd - left renal cyst only  03/96   Left knee arthroscopy  02/2005   TONSILLECTOMY AND ADENOIDECTOMY  Age 33   UGI - hiatal hernia  03/96   Patient Active Problem List   Diagnosis Date Noted   Acne rosacea 09/08/2021   Benign neoplasm of skin of lower limb 09/08/2021   History of neoplasm 09/08/2021   Melanocytic nevi of right upper limb, including shoulder 09/08/2021   Melanocytic nevi of trunk 09/08/2021   Sebaceous cyst 09/08/2021   Gallstones 09/02/2021   Nephrolithiasis 09/02/2021   GERD (gastroesophageal reflux disease) 09/02/2021   Atrophic vaginitis 08/06/2021   Reactive depression  (situational) 08/06/2021   Healthcare maintenance 08/13/2020   Shoulder pain 09/19/2019   Knee pain 07/27/2017   Hypothyroidism following radioiodine therapy 05/09/2016   Advance care planning 01/05/2015   Hyperglycemia 01/05/2015   Medicare welcome exam 11/30/2013   Irritability 11/30/2013   Obesity (BMI 30-39.9) 12/07/2012   Fibroids 12/06/2012   Multiple thyroid nodules 08/30/2012   HLD (hyperlipidemia) 07/26/2007   PAP SMEAR, ABNORMAL 07/26/2007   Referring Provider: Garrel Ridgel, DPM   REFERRING DIAG:  Tear of peroneal tendon, right, subsequent encounter [S86.311D], Peroneal tendinitis of right lower extremity [M76.71]    THERAPY DIAG:  Pain in right ankle and joints of right foot  Localized edema  PERTINENT HISTORY: None  PRECAUTIONS:   PRECAUTIONS: None   WEIGHT BEARING RESTRICTIONS No  SUBJECTIVE:  Pt reports that she feel that she has made some good progress with therapy.  She would like to try to complete HEP at home and d/c today.  Pain:  Are you having pain? Yes Pain location: lateral R ankle and plantar surface of R foot to first met head NPRS scale: 0/10 current, 2/10 pain with activity Aggravating factors: walking, standing  Relieving factors: rest, ice Pain description: constant, sharp, and dull Severity: moderate Irritability: moderate Stage: Chronic Stability:  staying the same 24 hour pattern: no clear pattern    OBJECTIVE:              GENERAL OBSERVATION:                     High arches   SENSATION:          Light touch: Appears intact   PALPATION: TTP base of 5th and 1st met head, some mild swelling below lateral malleolus   LE ROM:   MMT Right 10/15/2021 Left 10/15/2021  Hip flexion (L2, L3)      Knee extension (L3)      Knee flexion      Hip abduction      Hip extension      Hip external rotation      Hip internal rotation      Hip adduction      Ankle dorsiflexion (L4) 4 OC 8 OC  Ankle plantarflexion (S1) N N  Ankle  inversion 45 45  Ankle eversion 30 25  Great Toe ext (L5)                                     (Blank rows = not tested, * = concordant pain with testing)     LE MMT:   ROM Right 10/15/2021 Left 10/15/2021  Hip flexion      Hip extension      Hip abduction      Hip adduction      Hip internal rotation      Hip external rotation      Knee flexion      Knee extension      Ankle dorsiflexion 5 5  Ankle plantarflexion 4* 5  Ankle inversion 5 5  Ankle eversion 4* 5    (Blank rows = not tested, score listed is out of 5 possible points.  N = WNL, D = diminished, C = clear for gross weakness with myotome testing, * = concordant pain with testing)    FUNCTIONAL TESTS:    Progressive balance screen:   Feet together: 10'' Semi Tandem: R in rear 10'', L in rear 10'' Tandem: R in rear unable, L in rear unable     GAIT: Comments: slight reduced time in stance R   PATIENT SURVEYS:  FOTO 44% predicted to improved to 62%  Treatment: 2/16/231            Therapeutic Exercise:             -Rec bike L4 x 5 minutes   - slant board stretch 45'' x3  - SLS - 45'' x3  - eccentric heel raise 3x10  - step up - fwd and lat - 6'' - 2x10 ea -tandem on foam 60''x3 each  - R hip hike on step - 2x20  - hip abduction RTB - 3x10   -hip extension RTB 10 x 2 each   Therapeutic Activity - collecting information for goals, checking progress, and reviewing with patient   Treatment: 2/14/231            Therapeutic Exercise:             -Rec bike L4 x 5 minutes   - slant board stretch 45'' x3 - wooden rocker board DF/PF - 2x20   - SLS - 35'' x1  - heel/toe raise on 2'' step - 2x20  -  step up - fwd and lat - 6'' - 2x10 ea  - tandem stance 60 sec  with each foot forward  -tandem on foam 60''x1 each  - R hip hike on step - 2x20  - lateral walking with RTB - 4 laps  -hip extension RTB 10 x 2 each   --sit-stand x 10   Treatment: 2/9/231            Therapeutic Exercise:             -Rec bike L4  x 5 minutes   - slant board stretch 45'' x3 - wooden rocker board DF/PF - 2x20   - feet together EC on foam 45''x3  - SLS - 15'' x5  - heel/toe raise on 2'' step - 2x20  - step up - fwd and lat - 6'' - 2x10 ea  - SL RDL to chair (reach) - 3x10   - R hip hike on step - 2x20  - lateral walking with RTB - 4 laps    Treatment: 2/7/231            Therapeutic Exercise:             -Rec bike L4 x 5 minutes   - slant board stretch 45'' x3 - wooden rocker board DF/PF - 2x20   - feet together EC on foam 45''x3  - tandem on foam 45''x3 ea  - semi tandem EC 3x45''  - heel/toe raise on 2'' step - 2x20  - step up - fwd and lat - 4'' - 2x10 ea  - R hip hike on step - 2x20  - lateral walking with RTB - 4 laps  Treatment: 10/30/21            Therapeutic Exercise:             -Rec bike L3 x 5 minutes             - BAPS board fwd/bck lat CW/CC - L3  - slant board stretch 45'' x3            - green band inv/eversion/DF - rocker board DF/PF - 2x20              -tandem x 45 sec each  - semi tandem on foam 45''x3 ea  - semi tandem EC 3x45''  - heel/toe raise - 2x20  - step up - fwd and lat - 4'' - 2x10 ea  - lateral walking with RTB - 4 laps       HOME EXERCISE PROGRAM: Access Code: [email or web address] URL: [email or web address] Date: 11/12/2021 Prepared by: Shearon Balo  Exercises Single Leg Stance - 2 x daily - 6 x weekly - 1 sets - 3 reps - 60 hold Standing Heel Raise - 1 x daily - 7 x weekly - 3 sets - 10 reps Standing Eccentric Heel Raise - 1 x daily - 7 x weekly - 3 sets - 10 reps Hip Abduction with Resistance Loop - 1 x daily - 7 x weekly - 3 sets - 10 reps Hip Extension with Resistance Loop - 1 x daily - 7 x weekly - 3 sets - 10 reps          ASSESSMENT:   CLINICAL IMPRESSION: Sharon Benjamin has progressed well with therapy.  Improved impairments include: balance, LE strength, hip strength, gait, pain.  Functional improvements include: walking, standing,  bending, squatting, ability to complete housework.  Progressions needed include: continued work at home with  HEP.  Barriers to progress include: none.  Please see GOALS section for progress on short term and long term goals established at evaluation.  I recommend D/C home with HEP; pt agrees with plan.     GOALS:   SHORT TERM GOALS:   STG Name Target Date Goal status  1 Sharon Benjamin will be >75% HEP compliant to improve carryover between sessions and facilitate independent management of condition   Baseline: No HEP 11/05/2021 MET 10/23/21    LONG TERM GOALS:    LTG Name Target Date Goal status  1 Sharon Benjamin will be able to complete 10x R single leg heel raises, not limited by pain   Eval: limited by pain  2/16: MET 12/10/2021 MET  2 Sharon Benjamin will improve FOTO score from 44 (baseline) to 62 as a proxy for functional improvement  2/7: 59  2/16: 55 12/10/2021 Partially MET 11/10/21  3 Sharon Benjamin will report >/= 50% decrease in pain from evaluation    Baseline: 6/10 max pain; 11/10/21: 3/10 max 12/10/2021 MET 2/16  4 Sharon Benjamin will be confident express confidence in advanced HEP in order to self manage condition 12/10/2021 MET  2/16  5 Sharon Benjamin will be able to stand for >30'' in tandem stance, to show a significant improvement in balance in order to reduce fall risk    Baseline: unable; 11/10/21: 60 sec  12/10/2021 Met 11/10/21    PLAN: PT FREQUENCY: 1-2x/week   PT DURATION: 8 weeks (Ending 12/10/2021)   PLANNED INTERVENTIONS: Therapeutic exercises, Therapeutic activity, Neuro Muscular re-education, Gait training, Patient/Family education, Joint mobilization, Dry Needling, Electrical stimulation, Spinal mobilization and/or manipulation, Moist heat, Taping, Vasopneumatic device, Ionotophoresis 47m/ml Dexamethasone, and Manual therapy   PLAN FOR NEXT SESSION: progressive peroneal strengthening, progressive balance    KMathis DadPT 10:24 AM, 11/12/2021

## 2021-11-30 ENCOUNTER — Other Ambulatory Visit: Payer: Self-pay | Admitting: Podiatry

## 2022-03-30 DIAGNOSIS — J320 Chronic maxillary sinusitis: Secondary | ICD-10-CM | POA: Diagnosis not present

## 2022-04-15 ENCOUNTER — Other Ambulatory Visit: Payer: Self-pay | Admitting: Podiatry

## 2022-04-28 ENCOUNTER — Encounter: Payer: Self-pay | Admitting: Family Medicine

## 2022-04-28 ENCOUNTER — Ambulatory Visit (INDEPENDENT_AMBULATORY_CARE_PROVIDER_SITE_OTHER): Payer: Medicare Other | Admitting: Family Medicine

## 2022-04-28 VITALS — BP 142/80 | HR 81 | Temp 97.2°F | Ht 65.0 in | Wt 239.1 lb

## 2022-04-28 DIAGNOSIS — J0101 Acute recurrent maxillary sinusitis: Secondary | ICD-10-CM

## 2022-04-28 MED ORDER — DOXYCYCLINE HYCLATE 100 MG PO TABS: 100.0000 mg | ORAL_TABLET | Freq: Two times a day (BID) | ORAL | 0 refills | Status: AC

## 2022-04-28 NOTE — Patient Instructions (Signed)
Wear sunblock or hat while on the medication   Sinus Congestion 1) Neti Pot (Saline rinse) -- 2 times day -- if tolerated 2) Flonase (Store Brand ok) - once daily - for 2 weeks 3) Daily allergy pill - for 2 weeks  Antibiotics for 7 days  Return to see Dr. Damita Dunnings

## 2022-04-28 NOTE — Progress Notes (Signed)
Subjective:     Sharon Benjamin is a 66 y.o. female presenting for Sinusitis (X 1 month. Finished one course of Abx at beginning of July) and Headache     Sinusitis This is a new problem. The current episode started 1 to 4 weeks ago. The problem is unchanged. There has been no fever. Associated symptoms include congestion, headaches, shortness of breath and sinus pressure. Pertinent negatives include no chills, coughing, ear pain, sneezing, sore throat or swollen glands. Treatments tried: saline spray, abx, advil cold/sinus, humdifier. The treatment provided mild relief.  Headache  Associated symptoms include sinus pressure. Pertinent negatives include no coughing, ear pain, sore throat or swollen glands.      Review of Systems  Constitutional:  Negative for chills.  HENT:  Positive for congestion and sinus pressure. Negative for ear pain, sneezing and sore throat.   Respiratory:  Positive for shortness of breath. Negative for cough.   Neurological:  Positive for headaches.     Social History   Tobacco Use  Smoking Status Former  Smokeless Tobacco Never  Tobacco Comments   quit over 30 years ago        Objective:    BP Readings from Last 3 Encounters:  04/28/22 (!) 142/80  09/01/21 124/82  04/02/21 (!) 177/96   Wt Readings from Last 3 Encounters:  04/28/22 239 lb 2 oz (108.5 kg)  09/01/21 238 lb (108 kg)  08/07/21 243 lb (110.2 kg)    BP (!) 142/80   Pulse 81   Temp (!) 97.2 F (36.2 C) (Temporal)   Ht '5\' 5"'$  (1.651 m)   Wt 239 lb 2 oz (108.5 kg)   SpO2 96%   BMI 39.79 kg/m    Physical Exam Constitutional:      General: She is not in acute distress.    Appearance: She is well-developed. She is not diaphoretic.  HENT:     Head: Normocephalic and atraumatic.     Right Ear: Tympanic membrane and ear canal normal.     Left Ear: Tympanic membrane and ear canal normal.     Nose: Mucosal edema and rhinorrhea present.     Right Sinus: Maxillary sinus  tenderness and frontal sinus tenderness present.     Left Sinus: No maxillary sinus tenderness or frontal sinus tenderness.     Mouth/Throat:     Pharynx: Uvula midline. Posterior oropharyngeal erythema present. No oropharyngeal exudate.     Tonsils: 0 on the right. 0 on the left.  Eyes:     General: No scleral icterus.    Conjunctiva/sclera: Conjunctivae normal.  Cardiovascular:     Rate and Rhythm: Normal rate and regular rhythm.     Heart sounds: Normal heart sounds. No murmur heard. Pulmonary:     Effort: Pulmonary effort is normal. No respiratory distress.     Breath sounds: Normal breath sounds.  Musculoskeletal:     Cervical back: Neck supple.  Lymphadenopathy:     Cervical: No cervical adenopathy.  Skin:    General: Skin is warm and dry.     Capillary Refill: Capillary refill takes less than 2 seconds.  Neurological:     Mental Status: She is alert.           Assessment & Plan:   Problem List Items Addressed This Visit   None Visit Diagnoses     Acute recurrent maxillary sinusitis    -  Primary   Relevant Medications   doxycycline (VIBRA-TABS) 100 MG tablet  Completed Augmentin last month. Persistent symptoms Advised allergy medication, saline rinse, Flonase, and will do just course of doxycycline for 7 days.  Turn to see PCP if symptoms do not resolve  Return if symptoms worsen or fail to improve.  Lesleigh Noe, MD

## 2022-05-13 DIAGNOSIS — Z1231 Encounter for screening mammogram for malignant neoplasm of breast: Secondary | ICD-10-CM | POA: Diagnosis not present

## 2022-07-03 IMAGING — MR MR ANKLE*R* W/O CM
5 series · 38 of 40 positions shown · non-contrast
Comparison: None.

CLINICAL DATA: Right ankle pain.

EXAM:
MRI OF THE RIGHT ANKLE WITHOUT CONTRAST
TECHNIQUE: Multiplanar, multisequence MR imaging of the ankle was performed. No
intravenous contrast was administered.

[Series 3: PD fat-sat · axial · 3.0mm · 0.50mm/px · z∈[-107,+14]mm · 9 of 32 slices shown]
[im 1/32]
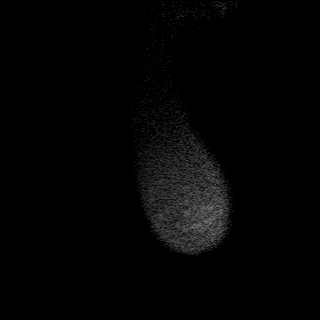
[im 4/32]
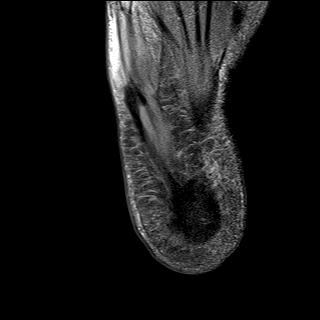
[im 8/32]
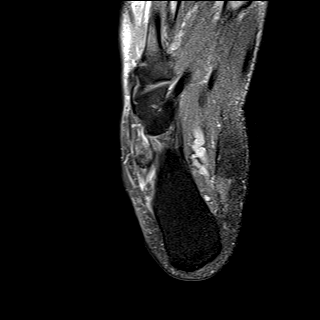
[im 12/32]
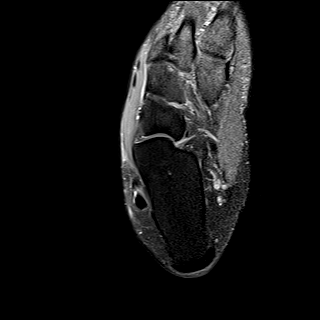
[im 16/32]
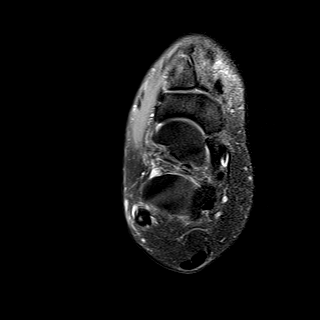
[im 20/32]
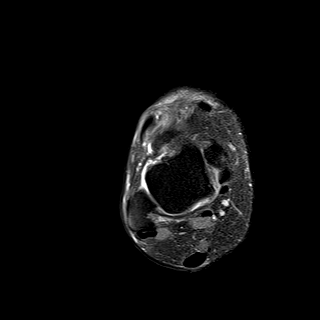
[im 24/32]
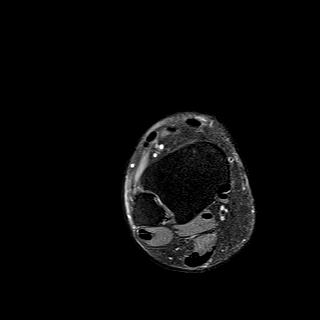
[im 28/32]
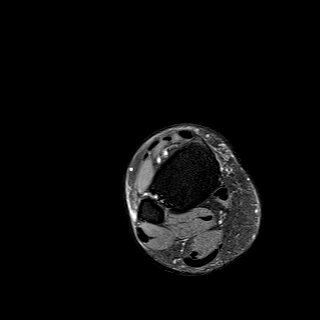
[im 32/32]
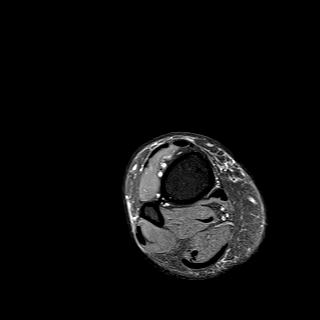

[Series 4: T2 fat-sat · axial · 3.0mm · 0.50mm/px · z∈[-107,+14]mm · 9 of 32 slices shown (1 of 2)]
[im 1/32]
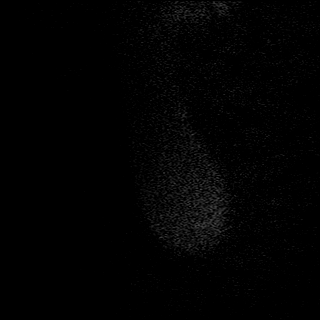
[im 4/32]
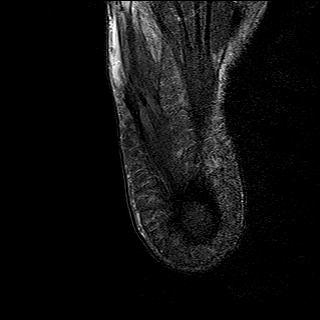
[im 8/32]
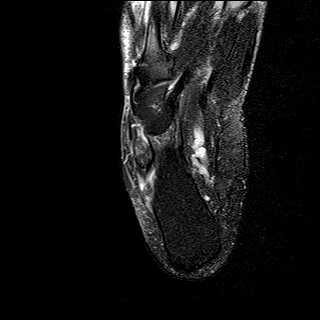
[im 12/32]
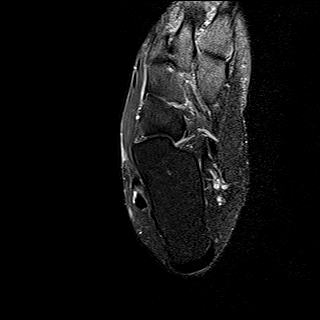
[im 16/32]
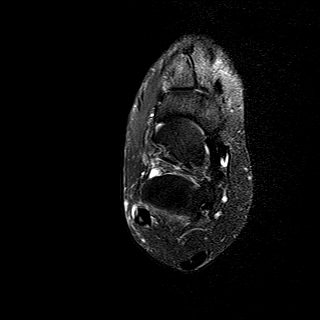
[im 20/32]
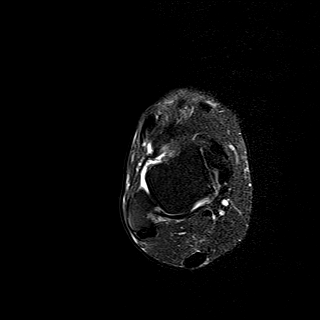
[im 24/32]
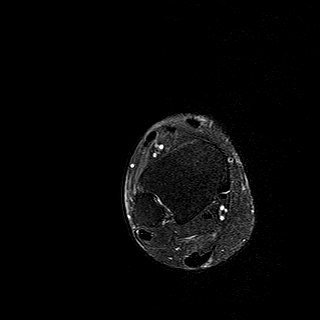
[im 28/32]
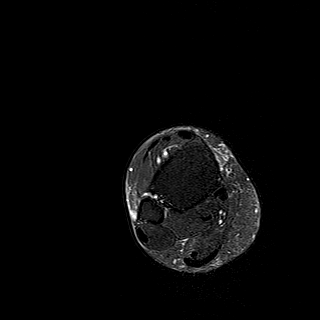
[im 32/32]
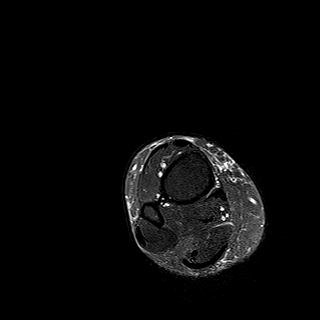

[Series 5: T1 · sagittal · 4.0mm · 0.56mm/px · 6 of 19 slices shown]
[im 1/19]
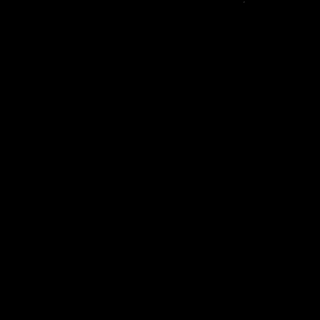
[im 4/19]
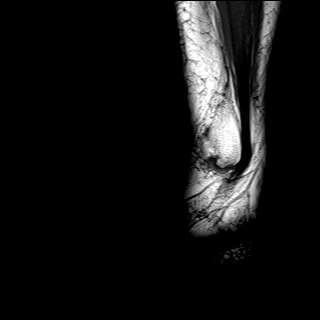
[im 8/19]
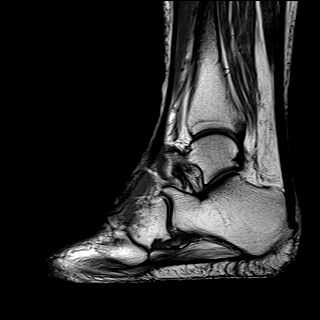
[im 11/19]
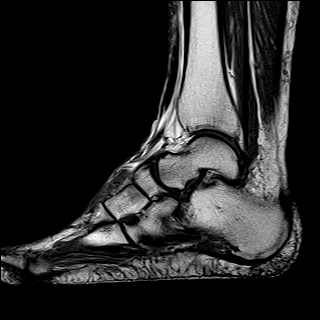
[im 15/19]
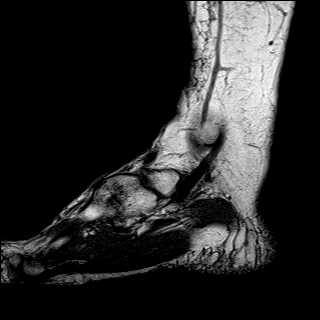
[im 19/19]
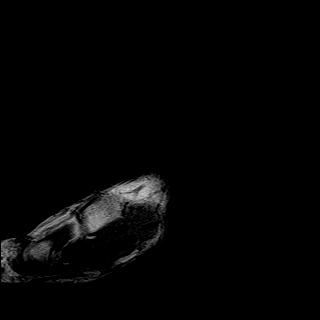

[Series 6: STIR · sagittal · 4.0mm · 0.35mm/px · 6 of 19 slices shown]
[im 1/19]
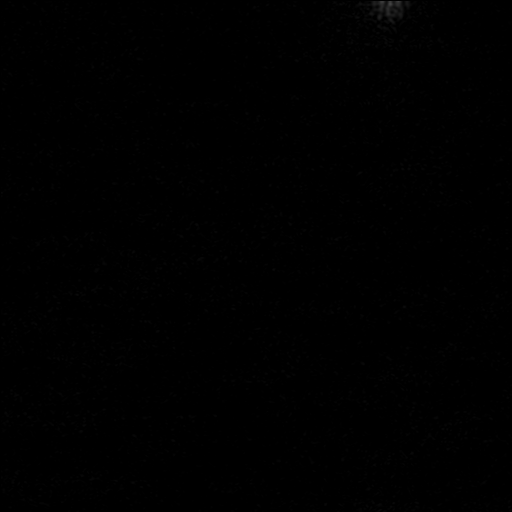
[im 4/19]
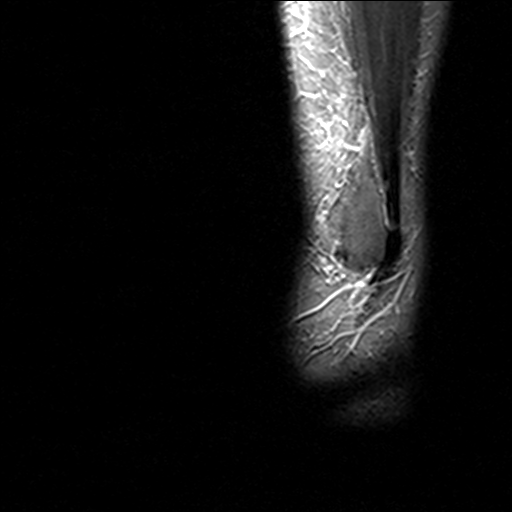
[im 8/19]
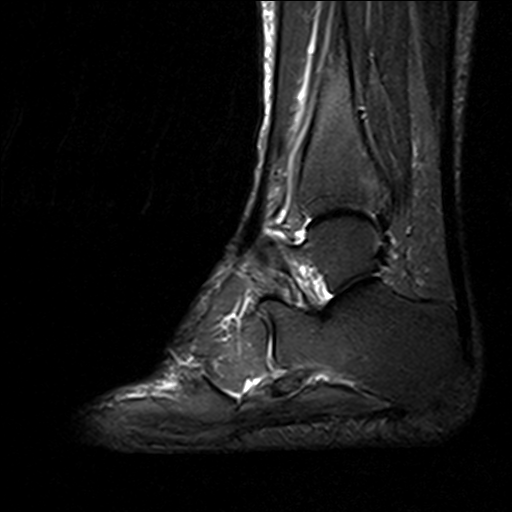
[im 11/19]
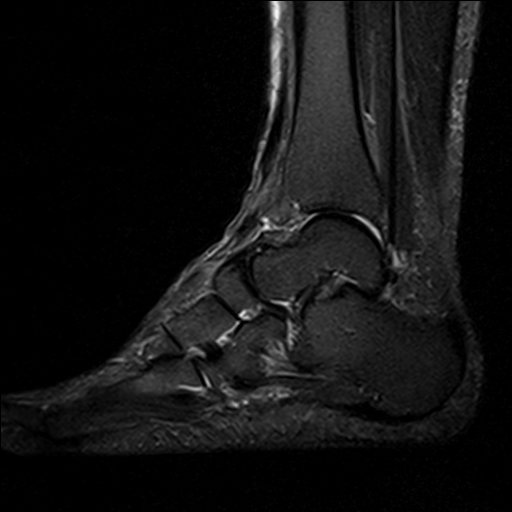
[im 15/19]
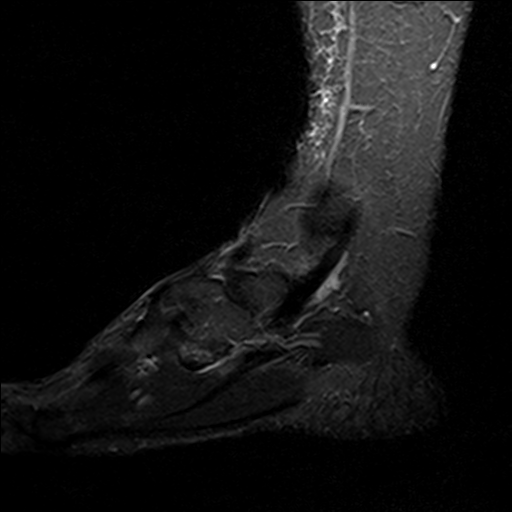
[im 19/19]
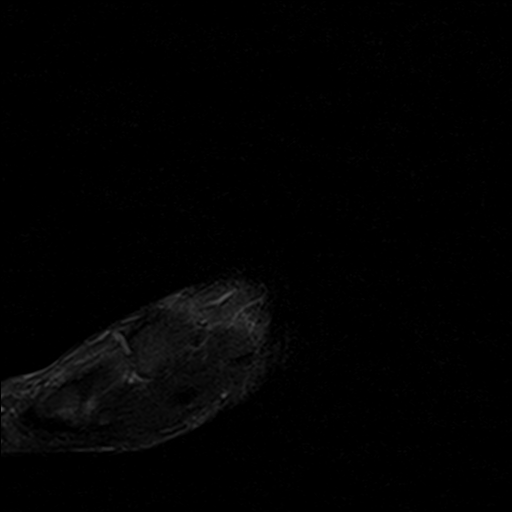

[Series 7: T2 fat-sat · coronal · 3.0mm · 0.50mm/px · 8 of 35 slices shown (2 of 2)]
[im 1/35]
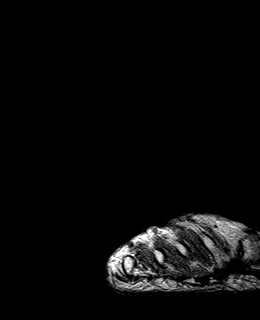
[im 4/35]
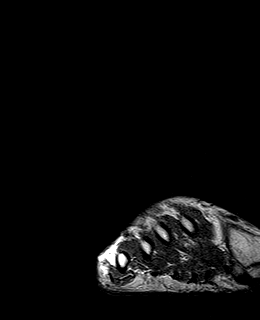
[im 12/35]
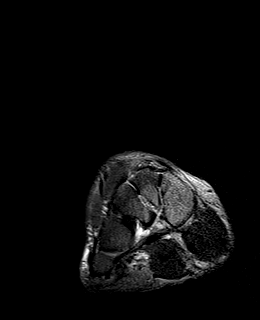
[im 16/35]
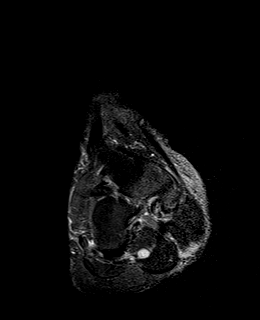
[im 19/35]
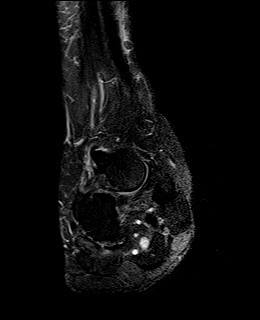
[im 23/35]
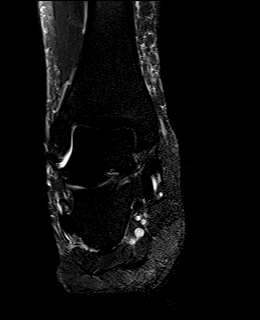
[im 31/35]
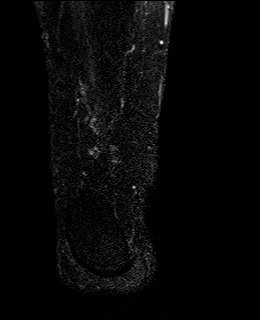
[im 35/35]
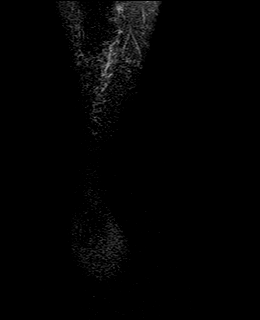

[38 of 40 positions shown; findings below may reference images not displayed]

FINDINGS: TENDONS

Peroneal: Peroneus longus tendon is intact. Os peroneus with bone
marrow edema within the accessory ossicle as can be seen with
painful os peroneus syndrome. Mild tendinosis of the peroneus brevis
with a small partial-thickness tear just beyond the lateral
malleolus.

Posteromedial: Posterior tibial tendon intact. Flexor hallucis
longus tendon intact. Flexor digitorum longus tendon intact.

Anterior: Tibialis anterior tendon intact. Extensor hallucis longus
tendon intact Extensor digitorum longus tendon intact.

Achilles:  Intact.

Plantar Fascia: Intact.

LIGAMENTS

Lateral: Anterior talofibular ligament intact. Calcaneofibular
ligament intact. Posterior talofibular ligament intact. Anterior and
posterior tibiofibular ligaments intact.

Medial: Deltoid ligament intact. Spring ligament intact.

CARTILAGE

Ankle Joint: No joint effusion. Normal ankle mortise. No chondral
defect.

Subtalar Joints/Sinus Tarsi: Normal subtalar joints. No subtalar
joint effusion. Normal sinus tarsi.

Bones: Mild osteoarthritis of the talonavicular joint. Relative Elgabi
Canelo. Small plantar calcaneal spur. No fracture or dislocation.

Soft Tissue: No fluid collection or hematoma. Muscles are normal
without edema or atrophy. Tarsal tunnel is normal.
IMPRESSION: 1. Os peroneus with bone marrow edema within the accessory ossicle
as can be seen with painful os peroneus syndrome.
2. Mild tendinosis of the peroneus brevis with a small
partial-thickness tear just beyond the lateral malleolus.

## 2022-08-04 ENCOUNTER — Other Ambulatory Visit: Payer: Self-pay | Admitting: Podiatry

## 2022-08-11 ENCOUNTER — Ambulatory Visit (INDEPENDENT_AMBULATORY_CARE_PROVIDER_SITE_OTHER): Payer: Medicare Other

## 2022-08-11 VITALS — Wt 239.0 lb

## 2022-08-11 DIAGNOSIS — Z0000 Encounter for general adult medical examination without abnormal findings: Secondary | ICD-10-CM

## 2022-08-11 NOTE — Progress Notes (Signed)
Virtual Visit via Telephone Note  I connected with  Sharon Benjamin on 08/11/22 at 10:30 AM EST by telephone and verified that I am speaking with the correct person using two identifiers.  Location: Patient: home Provider: [facility] Persons participating in the virtual visit: [place]   I discussed the limitations, risks, security and privacy concerns of performing an evaluation and management service by telephone and the availability of in person appointments. The patient expressed understanding and agreed to proceed.  Interactive audio and video telecommunications were attempted between this nurse and patient, however failed, due to patient having technical difficulties OR patient did not have access to video capability.  We continued and completed visit with audio only.  Some vital signs may be absent or patient reported.   Dionisio David, LPN  Subjective:   Sharon Benjamin is a 66 y.o. female who presents for Medicare Annual (Subsequent) preventive examination.  Review of Systems     Cardiac Risk Factors include: advanced age (>32mn, >>37women);dyslipidemia     Objective:    There were no vitals filed for this visit. There is no height or weight on file to calculate BMI.     08/11/2022   10:37 AM 10/15/2021    1:44 PM 08/07/2021   10:29 AM 03/26/2021    6:16 PM 08/05/2020    9:50 AM 02/03/2016    8:49 AM 01/13/2016    3:52 PM  Advanced Directives  Does Patient Have a Medical Advance Directive? No No No No No No No  Would patient like information on creating a medical advance directive? No - Patient declined No - Patient declined Yes (MAU/Ambulatory/Procedural Areas - Information given)  No - Patient declined  No - patient declined information    Current Medications (verified) Outpatient Encounter Medications as of 08/11/2022  Medication Sig   fexofenadine (ALLEGRA) 180 MG tablet Take 180 mg by mouth daily as needed.    lansoprazole  (PREVACID) 15 MG capsule Take 1 capsule (15 mg total) by mouth daily as needed.   levothyroxine (SYNTHROID) 88 MCG tablet Take 1 tablet (88 mcg total) by mouth daily.   meloxicam (MOBIC) 15 MG tablet TAKE 1 TABLET (15 MG TOTAL) BY MOUTH DAILY.   simvastatin (ZOCOR) 20 MG tablet TAKE 1 TABLET BY MOUTH EVERYDAY AT BEDTIME   amoxicillin-clavulanate (AUGMENTIN) 875-125 MG tablet Take 1 tablet by mouth 2 (two) times daily. (Patient not taking: Reported on 08/11/2022)   No facility-administered encounter medications on file as of 08/11/2022.    Allergies (verified) Lipitor [atorvastatin]   History: Past Medical History:  Diagnosis Date   Allergy    Arthritis    Cataract    bi;lateral   Diverticulosis of colon    Gallstones    GERD (gastroesophageal reflux disease)    Hyperlipidemia    Insomnia    with prn ambien use, rare as of 2013   Nephrolithiasis    Thyroid disease    Past Surgical History:  Procedure Laterality Date   Abd sonogram - left renal cyst, GB polyp  03/96   ABDOMINAL HYSTERECTOMY     CATARACT EXTRACTION Bilateral    COLONOSCOPY     CT abd - left renal cyst only  03/96   Left knee arthroscopy  02/2005   TONSILLECTOMY AND ADENOIDECTOMY  Age 31   UGI - hiatal hernia  03/96   Family History  Problem Relation Age of Onset   Hyperlipidemia Mother    Hypertension Mother  Asthma Mother    Arthritis Mother        Psoriatic arthritis   Cancer Father        Esophagus, was a smoker and used ETOH   Alcohol abuse Father    Esophageal cancer Father    Heart disease Maternal Uncle        CHF   Heart disease Maternal Grandmother        MI   Heart disease Other        Angina   Cancer Maternal Aunt        Ovarian   Cancer Paternal Aunt        Breast   Breast cancer Paternal Aunt    Cancer Paternal Uncle        Lung cancer   Diabetes Paternal Grandmother    Cancer Paternal Aunt        Liver cancer   Cancer Paternal Uncle        Lung cancer   Colon cancer Neg  Hx    Colon polyps Neg Hx    Rectal cancer Neg Hx    Stomach cancer Neg Hx    Social History   Socioeconomic History   Marital status: Married    Spouse name: Not on file   Number of children: 0   Years of education: Not on file   Highest education level: Not on file  Occupational History   Occupation: Aeronautical engineer: [employer]  Tobacco Use   Smoking status: Former   Smokeless tobacco: Never   Tobacco comments:    quit over 30 years ago  Substance and Sexual Activity   Alcohol use: Yes    Alcohol/week: 0.0 standard drinks of alcohol    Comment: rarely   Drug use: No   Sexual activity: Not on file  Other Topics Concern   Not on file  Social History Narrative   Retired as Optometrist for [employer]   Married 1983   Social Determinants of Health   Financial Resource Strain: Low Risk  (08/11/2022)   Overall Financial Resource Strain (CARDIA)    Difficulty of Paying Living Expenses: Not hard at all  Food Insecurity: No Food Insecurity (08/11/2022)   Hunger Vital Sign    Worried About Running Out of Food in the Last Year: Never true    Ran Out of Food in the Last Year: Never true  Transportation Needs: No Transportation Needs (08/11/2022)   PRAPARE - Hydrologist (Medical): No    Lack of Transportation (Non-Medical): No  Physical Activity: Inactive (08/11/2022)   Exercise Vital Sign    Days of Exercise per Week: 0 days    Minutes of Exercise per Session: 0 min  Stress: No Stress Concern Present (08/11/2022)   [place]    Feeling of Stress : Only a little  Social Connections: Unknown (08/11/2022)   Social Connection and Isolation Panel [NHANES]    Frequency of Communication with Friends and Family: Once a week    Frequency of Social Gatherings with Friends and Family: Not on file    Attends Religious Services: Never    Marine scientist or  Organizations: No    Attends Archivist Meetings: Never    Marital Status: Married    Tobacco Counseling Counseling given: Not Answered Tobacco comments: quit over 30 years ago   Clinical Intake:  Pre-visit preparation completed: Yes  Pain :  No/denies pain     Nutritional Risks: None Diabetes: No  How often do you need to have someone help you when you read instructions, pamphlets, or other written materials from your doctor or pharmacy?: 1 - Never  Diabetic?no  Interpreter Needed?: No  Information entered by :: Kirke Shaggy, LPN   Activities of Daily Living    08/11/2022   10:38 AM  In your present state of health, do you have any difficulty performing the following activities:  Hearing? 1  Vision? 0  Difficulty concentrating or making decisions? 0  Walking or climbing stairs? 0  Dressing or bathing? 0  Doing errands, shopping? 0  Preparing Food and eating ? N  Using the Toilet? N  In the past six months, have you accidently leaked urine? N  Do you have problems with loss of bowel control? N  Managing your Medications? N  Managing your Finances? N  Housekeeping or managing your Housekeeping? N    Patient Care Team: Tonia Ghent, MD as PCP - General (Family Medicine) Abbie Sons, MD (Urology) Marylynn Pearson, MD as Consulting Physician (Obstetrics and Gynecology)  Indicate any recent Medical Services you may have received from other than [facility] providers in the past year (date may be approximate).     Assessment:   This is a routine wellness examination for Sharon Benjamin.  Hearing/Vision screen Hearing Screening - Comments:: Wears aids Vision Screening - Comments:: No glasses  Dietary issues and exercise activities discussed: Current Exercise Habits: The patient does not participate in regular exercise at present   Goals Addressed             This Visit's Progress    DIET - EAT MORE FRUITS AND VEGETABLES         Depression  Screen    08/11/2022   10:35 AM 08/12/2020    9:16 AM 08/05/2020    9:52 AM 09/17/2019    3:21 PM 08/11/2018   10:54 AM 07/25/2017   11:01 AM  PHQ 2/9 Scores  PHQ - 2 Score 1 0 0 0 0 0  PHQ- 9 Score 4 0 0       Fall Risk    08/11/2022   10:38 AM 08/07/2021   10:30 AM 08/12/2020    9:16 AM 08/05/2020    9:51 AM 09/17/2019    3:21 PM  Fall Risk   Falls in the past year? 0 0 0 0 0  Number falls in past yr: 0 0 0 0   Injury with Fall? 0 0 0 0   Risk for fall due to : No Fall Risks No Fall Risks  No Fall Risks   Follow up Falls prevention discussed;Falls evaluation completed Falls prevention discussed Falls evaluation completed Falls evaluation completed;Falls prevention discussed     FALL RISK PREVENTION PERTAINING TO THE HOME:  Any stairs in or around the home? Yes  If so, are there any without handrails? No  Home free of loose throw rugs in walkways, pet beds, electrical cords, etc? Yes  Adequate lighting in your home to reduce risk of falls? Yes   ASSISTIVE DEVICES UTILIZED TO PREVENT FALLS:  Life alert? No  Use of a cane, walker or w/c? No  Grab bars in the bathroom? Yes  Shower chair or bench in shower? No  Elevated toilet seat or a handicapped toilet? No    Cognitive Function:    08/05/2020    9:55 AM  MMSE - Mini Mental State Exam  Orientation  to time 5  Orientation to Place 5  Registration 3  Attention/ Calculation 5  Recall 3  Language- repeat 1        08/11/2022   10:38 AM  6CIT Screen  What Year? 0 points  What month? 0 points  What time? 0 points  Count back from 20 0 points  Months in reverse 0 points  Repeat phrase 0 points  Total Score 0 points    Immunizations Immunization History  Administered Date(s) Administered   Fluad Quad(high Dose 65+) 07/07/2020   Influenza, High Dose Seasonal PF 07/20/2021   Influenza,inj,Quad PF,6+ Mos 07/25/2017, 08/11/2018, 06/12/2019   Influenza-Unspecified 07/06/2012   PFIZER(Purple Top)SARS-COV-2  Vaccination 11/04/2019, 11/28/2019, 06/23/2020   PNEUMOCOCCAL CONJUGATE-20 09/01/2021   Pneumococcal Polysaccharide-23 09/17/2019   Tdap 11/29/2013   Unspecified SARS-COV-2 Vaccination 06/23/2020   Zoster Recombinat (Shingrix) 06/12/2019, 08/16/2019    TDAP status: Up to date  Flu Vaccine status: Due, Education has been provided regarding the importance of this vaccine. Advised may receive this vaccine at local pharmacy or Health Dept. Aware to provide a copy of the vaccination record if obtained from local pharmacy or Health Dept. Verbalized acceptance and understanding.  Pneumococcal vaccine status: Up to date  Covid-19 vaccine status: Completed vaccines  Qualifies for Shingles Vaccine? Yes   Zostavax completed No   Shingrix Completed?: Yes  Screening Tests Health Maintenance  Topic Date Due   COVID-19 Vaccine (5 - Mixed Product risk series) 08/18/2020   INFLUENZA VACCINE  04/27/2022   MAMMOGRAM  03/12/2023   Medicare Annual Wellness (AWV)  08/12/2023   TETANUS/TDAP  11/30/2023   COLONOSCOPY (Pts 45-38yr Insurance coverage will need to be confirmed)  02/02/2026   Pneumonia Vaccine 68 Years old  Completed   DEXA SCAN  Completed   Hepatitis C Screening  Completed   Zoster Vaccines- Shingrix  Completed   HPV VACCINES  Aged Out    Health Maintenance  Health Maintenance Due  Topic Date Due   COVID-19 Vaccine (5 - Mixed Product risk series) 08/18/2020   INFLUENZA VACCINE  04/27/2022    Colorectal cancer screening: Type of screening: Colonoscopy. Completed 02/03/16. Repeat every 10 years  Mammogram status: Completed had this year (2023) w/ [facility]. Repeat every year  Bone Density status: Completed 03/25/21. Results reflect: Bone density results: NORMAL. Repeat every 5 years.  Lung Cancer Screening: (Low Dose CT Chest recommended if Age 781-80years, 30 pack-year currently smoking OR have quit w/in 15years.) does not qualify.   Additional  Screening:  Hepatitis C Screening: does qualify; Completed 09/27/98  Vision Screening: Recommended annual ophthalmology exams for early detection of glaucoma and other disorders of the eye. Is the patient up to date with their annual eye exam?  Yes  Who is the provider or what is the name of the office in which the patient attends annual eye exams? Dr.Shapiro If pt is not established with a provider, would they like to be referred to a provider to establish care? No .   Dental Screening: Recommended annual dental exams for proper oral hygiene  Community Resource Referral / Chronic Care Management: CRR required this visit?  No   CCM required this visit?  No      Plan:     I have personally reviewed and noted the following in the patient's chart:   Medical and social history Use of alcohol, tobacco or illicit drugs  Current medications and supplements including opioid prescriptions. Patient is not currently taking opioid prescriptions.  Functional ability and status Nutritional status Physical activity Advanced directives List of other physicians Hospitalizations, surgeries, and ER visits in previous 12 months Vitals Screenings to include cognitive, depression, and falls Referrals and appointments  In addition, I have reviewed and discussed with patient certain preventive protocols, quality metrics, and best practice recommendations. A written personalized care plan for preventive services as well as general preventive health recommendations were provided to patient.     Dionisio David, LPN   [id number]   Nurse Notes: none

## 2022-08-11 NOTE — Patient Instructions (Signed)
Ms. Sharon Benjamin , Thank you for taking time to come for your Medicare Wellness Visit. I appreciate your ongoing commitment to your health goals. Please review the following plan we discussed and let me know if I can assist you in the future.   Screening recommendations/referrals: Colonoscopy: 02/03/16 Mammogram: 03/11/21- pt states had this year at [facility]  Bone Density: 03/25/21 Recommended yearly ophthalmology/optometry visit for glaucoma screening and checkup Recommended yearly dental visit for hygiene and checkup  Vaccinations: Influenza vaccine: 07/20/21 Pneumococcal vaccine: 09/01/21 Tdap vaccine: 11/29/13 Shingles vaccine: Shingrix 06/12/19, 08/16/19   Covid-19:11/04/19, 12/25/19, 06/23/20  Advanced directives: no  Conditions/risks identified: none  Next appointment: Follow up in one year for your annual wellness visit 08/15/23 @ 10:30 am by phone   Preventive Care 68 Years and Older, Female Preventive care refers to lifestyle choices and visits with your health care provider that can promote health and wellness. What does preventive care include? A yearly physical exam. This is also called an annual well check. Dental exams once or twice a year. Routine eye exams. Ask your health care provider how often you should have your eyes checked. Personal lifestyle choices, including: Daily care of your teeth and gums. Regular physical activity. Eating a healthy diet. Avoiding tobacco and drug use. Limiting alcohol use. Practicing safe sex. Taking low-dose aspirin every day. Taking vitamin and mineral supplements as recommended by your health care provider. What happens during an annual well check? The services and screenings done by your health care provider during your annual well check will depend on your age, overall health, lifestyle risk factors, and family history of disease. Counseling  Your health care provider may ask you questions about your: Alcohol use. Tobacco  use. Drug use. Emotional well-being. Home and relationship well-being. Sexual activity. Eating habits. History of falls. Memory and ability to understand (cognition). Work and work Statistician. Reproductive health. Screening  You may have the following tests or measurements: Height, weight, and BMI. Blood pressure. Lipid and cholesterol levels. These may be checked every 5 years, or more frequently if you are over 68 years old. Skin check. Lung cancer screening. You may have this screening every year starting at age 68 if you have a 30-pack-year history of smoking and currently smoke or have quit within the past 15 years. Fecal occult blood test (FOBT) of the stool. You may have this test every year starting at age 68. Flexible sigmoidoscopy or colonoscopy. You may have a sigmoidoscopy every 5 years or a colonoscopy every 10 years starting at age 68. Hepatitis C blood test. Hepatitis B blood test. Sexually transmitted disease (STD) testing. Diabetes screening. This is done by checking your blood sugar (glucose) after you have not eaten for a while (fasting). You may have this done every 1-3 years. Bone density scan. This is done to screen for osteoporosis. You may have this done starting at age 68. Mammogram. This may be done every 1-2 years. Talk to your health care provider about how often you should have regular mammograms. Talk with your health care provider about your test results, treatment options, and if necessary, the need for more tests. Vaccines  Your health care provider may recommend certain vaccines, such as: Influenza vaccine. This is recommended every year. Tetanus, diphtheria, and acellular pertussis (Tdap, Td) vaccine. You may need a Td booster every 10 years. Zoster vaccine. You may need this after age 68. Pneumococcal 13-valent conjugate (PCV13) vaccine. One dose is recommended after age 68. Pneumococcal polysaccharide (PPSV23) vaccine.  One dose is recommended  after age 62. Talk to your health care provider about which screenings and vaccines you need and how often you need them. This information is not intended to replace advice given to you by your health care provider. Make sure you discuss any questions you have with your health care provider. Document Released: 10/10/2015 Document Revised: 06/02/2016 Document Reviewed: 07/15/2015 Elsevier Interactive Patient Education  2017 [place] Prevention in the Home Falls can cause injuries. They can happen to people of all ages. There are many things you can do to make your home safe and to help prevent falls. What can I do on the outside of my home? Regularly fix the edges of walkways and driveways and fix any cracks. Remove anything that might make you trip as you walk through a door, such as a raised step or threshold. Trim any bushes or trees on the path to your home. Use bright outdoor lighting. Clear any walking paths of anything that might make someone trip, such as rocks or tools. Regularly check to see if handrails are loose or broken. Make sure that both sides of any steps have handrails. Any raised decks and porches should have guardrails on the edges. Have any leaves, snow, or ice cleared regularly. Use sand or salt on walking paths during winter. Clean up any spills in your garage right away. This includes oil or grease spills. What can I do in the bathroom? Use night lights. Install grab bars by the toilet and in the tub and shower. Do not use towel bars as grab bars. Use non-skid mats or decals in the tub or shower. If you need to sit down in the shower, use a plastic, non-slip stool. Keep the floor dry. Clean up any water that spills on the floor as soon as it happens. Remove soap buildup in the tub or shower regularly. Attach bath mats securely with double-sided non-slip rug tape. Do not have throw rugs and other things on the floor that can make you trip. What can I do  in the bedroom? Use night lights. Make sure that you have a light by your bed that is easy to reach. Do not use any sheets or blankets that are too big for your bed. They should not hang down onto the floor. Have a firm chair that has side arms. You can use this for support while you get dressed. Do not have throw rugs and other things on the floor that can make you trip. What can I do in the kitchen? Clean up any spills right away. Avoid walking on wet floors. Keep items that you use a lot in easy-to-reach places. If you need to reach something above you, use a strong step stool that has a grab bar. Keep electrical cords out of the way. Do not use floor polish or wax that makes floors slippery. If you must use wax, use non-skid floor wax. Do not have throw rugs and other things on the floor that can make you trip. What can I do with my stairs? Do not leave any items on the stairs. Make sure that there are handrails on both sides of the stairs and use them. Fix handrails that are broken or loose. Make sure that handrails are as long as the stairways. Check any carpeting to make sure that it is firmly attached to the stairs. Fix any carpet that is loose or worn. Avoid having throw rugs at the top or bottom of  the stairs. If you do have throw rugs, attach them to the floor with carpet tape. Make sure that you have a light switch at the top of the stairs and the bottom of the stairs. If you do not have them, ask someone to add them for you. What else can I do to help prevent falls? Wear shoes that: Do not have high heels. Have rubber bottoms. Are comfortable and fit you well. Are closed at the toe. Do not wear sandals. If you use a stepladder: Make sure that it is fully opened. Do not climb a closed stepladder. Make sure that both sides of the stepladder are locked into place. Ask someone to hold it for you, if possible. Clearly mark and make sure that you can see: Any grab bars or  handrails. First and last steps. Where the edge of each step is. Use tools that help you move around (mobility aids) if they are needed. These include: Canes. Walkers. Scooters. Crutches. Turn on the lights when you go into a dark area. Replace any light bulbs as soon as they burn out. Set up your furniture so you have a clear path. Avoid moving your furniture around. If any of your floors are uneven, fix them. If there are any pets around you, be aware of where they are. Review your medicines with your doctor. Some medicines can make you feel dizzy. This can increase your chance of falling. Ask your doctor what other things that you can do to help prevent falls. This information is not intended to replace advice given to you by your health care provider. Make sure you discuss any questions you have with your health care provider. Document Released: 07/10/2009 Document Revised: 02/19/2016 Document Reviewed: 10/18/2014 Elsevier Interactive Patient Education  2017 [employer].

## 2022-08-22 ENCOUNTER — Other Ambulatory Visit: Payer: Self-pay | Admitting: Family Medicine

## 2022-08-22 DIAGNOSIS — E559 Vitamin D deficiency, unspecified: Secondary | ICD-10-CM

## 2022-08-22 DIAGNOSIS — E890 Postprocedural hypothyroidism: Secondary | ICD-10-CM

## 2022-08-22 DIAGNOSIS — E785 Hyperlipidemia, unspecified: Secondary | ICD-10-CM

## 2022-08-22 DIAGNOSIS — R739 Hyperglycemia, unspecified: Secondary | ICD-10-CM

## 2022-08-27 ENCOUNTER — Other Ambulatory Visit (INDEPENDENT_AMBULATORY_CARE_PROVIDER_SITE_OTHER): Payer: Medicare Other

## 2022-08-27 DIAGNOSIS — E890 Postprocedural hypothyroidism: Secondary | ICD-10-CM

## 2022-08-27 DIAGNOSIS — E559 Vitamin D deficiency, unspecified: Secondary | ICD-10-CM | POA: Diagnosis not present

## 2022-08-27 DIAGNOSIS — E785 Hyperlipidemia, unspecified: Secondary | ICD-10-CM | POA: Diagnosis not present

## 2022-08-27 DIAGNOSIS — R739 Hyperglycemia, unspecified: Secondary | ICD-10-CM | POA: Diagnosis not present

## 2022-08-27 LAB — LIPID PANEL
Cholesterol: 199 mg/dL (ref 0–200)
HDL: 36.7 mg/dL — ABNORMAL LOW (ref >39.00)
NonHDL: 162.3
Total CHOL/HDL Ratio: 5
Triglycerides: 219 mg/dL — ABNORMAL HIGH (ref 0.0–149.0)
VLDL: 43.8 mg/dL — ABNORMAL HIGH (ref 0.0–40.0)

## 2022-08-27 LAB — HEMOGLOBIN A1C: Hgb A1c MFr Bld: 9 % — ABNORMAL HIGH (ref 4.6–6.5)

## 2022-08-27 LAB — LDL CHOLESTEROL, DIRECT: Direct LDL: 135 mg/dL

## 2022-08-27 LAB — COMPREHENSIVE METABOLIC PANEL
ALT: 38 U/L — ABNORMAL HIGH (ref 0–35)
AST: 28 U/L (ref 0–37)
Albumin: 4.2 g/dL (ref 3.5–5.2)
Alkaline Phosphatase: 139 U/L — ABNORMAL HIGH (ref 39–117)
BUN: 14 mg/dL (ref 6–23)
CO2: 32 mEq/L (ref 19–32)
Calcium: 9 mg/dL (ref 8.4–10.5)
Chloride: 101 mEq/L (ref 96–112)
Creatinine, Ser: 0.73 mg/dL (ref 0.40–1.20)
GFR: 84.76 mL/min (ref >60.00)
Glucose, Bld: 256 mg/dL — ABNORMAL HIGH (ref 70–99)
Potassium: 4.8 mEq/L (ref 3.5–5.1)
Sodium: 140 mEq/L (ref 135–145)
Total Bilirubin: 0.6 mg/dL (ref 0.2–1.2)
Total Protein: 6.7 g/dL (ref 6.0–8.3)

## 2022-08-27 LAB — TSH: TSH: 3.88 u[IU]/mL (ref 0.35–5.50)

## 2022-08-27 LAB — VITAMIN D 25 HYDROXY (VIT D DEFICIENCY, FRACTURES): VITD: 22.47 ng/mL — ABNORMAL LOW (ref 30.00–100.00)

## 2022-09-03 ENCOUNTER — Ambulatory Visit (INDEPENDENT_AMBULATORY_CARE_PROVIDER_SITE_OTHER)
Admission: RE | Admit: 2022-09-03 | Discharge: 2022-09-03 | Disposition: A | Discharge status: Home / Self Care | Payer: Medicare Other | Source: Ambulatory Visit | Attending: Family Medicine | Admitting: Family Medicine

## 2022-09-03 ENCOUNTER — Ambulatory Visit (INDEPENDENT_AMBULATORY_CARE_PROVIDER_SITE_OTHER): Payer: Medicare Other | Admitting: Family Medicine

## 2022-09-03 ENCOUNTER — Encounter: Payer: Self-pay | Admitting: Family Medicine

## 2022-09-03 VITALS — BP 140/68 | HR 83 | Temp 97.8°F | Ht 65.0 in | Wt 243.0 lb

## 2022-09-03 DIAGNOSIS — E119 Type 2 diabetes mellitus without complications: Secondary | ICD-10-CM | POA: Diagnosis not present

## 2022-09-03 DIAGNOSIS — E559 Vitamin D deficiency, unspecified: Secondary | ICD-10-CM

## 2022-09-03 DIAGNOSIS — M25531 Pain in right wrist: Secondary | ICD-10-CM

## 2022-09-03 DIAGNOSIS — E890 Postprocedural hypothyroidism: Secondary | ICD-10-CM

## 2022-09-03 DIAGNOSIS — Z0000 Encounter for general adult medical examination without abnormal findings: Secondary | ICD-10-CM

## 2022-09-03 DIAGNOSIS — F4321 Adjustment disorder with depressed mood: Secondary | ICD-10-CM

## 2022-09-03 DIAGNOSIS — Z23 Encounter for immunization: Secondary | ICD-10-CM | POA: Diagnosis not present

## 2022-09-03 DIAGNOSIS — M25511 Pain in right shoulder: Secondary | ICD-10-CM

## 2022-09-03 DIAGNOSIS — Z7189 Other specified counseling: Secondary | ICD-10-CM

## 2022-09-03 DIAGNOSIS — K760 Fatty (change of) liver, not elsewhere classified: Secondary | ICD-10-CM

## 2022-09-03 MED ORDER — SIMVASTATIN 20 MG PO TABS: ORAL_TABLET | ORAL | 3 refills | Status: DC

## 2022-09-03 MED ORDER — VITAMIN D (ERGOCALCIFEROL) 1.25 MG (50000 UNIT) PO CAPS: 50000.0000 [IU] | ORAL_CAPSULE | ORAL | 0 refills | Status: DC

## 2022-09-03 MED ORDER — LEVOTHYROXINE SODIUM 88 MCG PO TABS: 88.0000 ug | ORAL_TABLET | Freq: Every day | ORAL | 3 refills | Status: DC

## 2022-09-03 NOTE — Patient Instructions (Addendum)
Go to the lab on the way out for your xrays.   If you have mychart we'll likely use that to update you.     Please ask the front about getting a copy of your most recent mammogram report.    Take care.  Glad to see you. Recheck in 3 months.  Labs at the visit.   Start vitamin D.  Use the eat right diet and try to get on the bike in the meantime.

## 2022-09-03 NOTE — Progress Notes (Unsigned)
Flu 2023 Shingles previously done PNA 2022 Tetanus 2015 covid vaccine 2021 Colon cancer screening with colonoscopy 2017 Breast cancer screening 2023 per gynecology.   [facility] 20[facility].   Advance directive-husband designated if patient were incapacitated.  Grief. Her sister died in 04/03/2023.  Her husband has chronic medical issues.  Another friend died this week, another was recently dx'd with leukemia.  She has a lot going on.  No SI/HI.    She passed a renal stone this summer.   Low vit D.  D/w pt about replacement and recheck later on.    DM. See above.  Diet has been off.  She has recumbent exercise bike to use at home.  Diet handout given to patient.    LFT elevation.  H/o hepatic steatosis. Likely DM2 related/exacerbated.  D/w pt.    Hypothyroidism.  TSH wnl. Compliant.   R arm pain.  Going on for weeks.  Pain with moving some pipes at her pool at the end of summer.   R wrist pain since spring 2023, when working in the yard.  R wrist can be puffy.  Still on meloxicam at baseline.    PMH and SH reviewed  Meds, vitals, and allergies reviewed.   ROS: Per HPI.  Unless specifically indicated otherwise in HPI, the patient denies:  General: fever. Eyes: acute vision changes ENT: sore throat Cardiovascular: chest pain Respiratory: SOB GI: vomiting GU: dysuria Musculoskeletal: acute back pain Derm: acute rash Neuro: acute motor dysfunction Psych: worsening mood Endocrine: polydipsia Heme: bleeding Allergy: hayfever  GEN: nad, alert and oriented HEENT: ncat NECK: supple w/o LA CV: rrr. PULM: ctab, no inc wob ABD: soft, +bs EXT: no edema SKIN: no acute rash   R shoulder with pain on int rotation and supraspinatus testing.  Pain sleeping on R side.  AC testing positive.   Ulnar side R wrist puffy and sore, pain with flex and ext.

## 2022-09-05 DIAGNOSIS — M25531 Pain in right wrist: Secondary | ICD-10-CM | POA: Insufficient documentation

## 2022-09-05 DIAGNOSIS — K760 Fatty (change of) liver, not elsewhere classified: Secondary | ICD-10-CM | POA: Insufficient documentation

## 2022-09-05 DIAGNOSIS — E559 Vitamin D deficiency, unspecified: Secondary | ICD-10-CM | POA: Insufficient documentation

## 2022-09-05 DIAGNOSIS — F4321 Adjustment disorder with depressed mood: Secondary | ICD-10-CM | POA: Insufficient documentation

## 2022-09-05 NOTE — Assessment & Plan Note (Signed)
Advance directive- husband designated if patient were incapacitated.  

## 2022-09-05 NOTE — Assessment & Plan Note (Signed)
Check plain films.  See notes on imaging.

## 2022-09-05 NOTE — Assessment & Plan Note (Signed)
Low vit D.  D/w pt about replacement and recheck later on.

## 2022-09-05 NOTE — Assessment & Plan Note (Signed)
Her sister died in June.  Her husband has chronic medical issues.  Another friend died this week, another was recently dx'd with leukemia.  She has a lot going on.  No SI/HI.  Condolences offered.  She will update me as needed.

## 2022-09-05 NOTE — Assessment & Plan Note (Signed)
See above.  Diet has been off.  She has not been able to exercise given all of the other people in her life.  She has recumbent exercise bike to use at home.  Diet handout given to patient.   Discussed "eat right diet" with low/healthy carbohydrate options.  She will try to work on that in the meantime and we can recheck labs later on.

## 2022-09-05 NOTE — Assessment & Plan Note (Signed)
Flu 2023 Shingles previously done PNA 2022 Tetanus 2015 covid vaccine 2021 Colon cancer screening with colonoscopy 2017 Breast cancer screening 2023 per gynecology.   [facility]   Advance directive-husband designated if patient were incapacitated.

## 2022-09-05 NOTE — Assessment & Plan Note (Signed)
Continue levothyroxine as is.

## 2022-09-05 NOTE — Assessment & Plan Note (Signed)
LFT elevation.  H/o hepatic steatosis. Likely DM2 related/exacerbated.  D/w pt about management of weight, sugar, etc. with diet and exercise.

## 2022-10-26 DIAGNOSIS — H52203 Unspecified astigmatism, bilateral: Secondary | ICD-10-CM | POA: Diagnosis not present

## 2022-10-26 DIAGNOSIS — Z961 Presence of intraocular lens: Secondary | ICD-10-CM | POA: Diagnosis not present

## 2022-10-26 DIAGNOSIS — H524 Presbyopia: Secondary | ICD-10-CM | POA: Diagnosis not present

## 2022-10-26 LAB — HM DIABETES EYE EXAM

## 2022-11-28 ENCOUNTER — Other Ambulatory Visit: Payer: Self-pay | Admitting: Podiatry

## 2022-12-13 ENCOUNTER — Encounter: Payer: Self-pay | Admitting: Family Medicine

## 2022-12-13 ENCOUNTER — Ambulatory Visit (INDEPENDENT_AMBULATORY_CARE_PROVIDER_SITE_OTHER): Payer: Medicare Other | Admitting: Family Medicine

## 2022-12-13 VITALS — BP 138/70 | HR 82 | Temp 98.2°F | Ht 65.0 in | Wt 234.0 lb

## 2022-12-13 DIAGNOSIS — E119 Type 2 diabetes mellitus without complications: Secondary | ICD-10-CM | POA: Diagnosis not present

## 2022-12-13 DIAGNOSIS — E559 Vitamin D deficiency, unspecified: Secondary | ICD-10-CM | POA: Diagnosis not present

## 2022-12-13 LAB — HEMOGLOBIN A1C: Hgb A1c MFr Bld: 8.9 % — ABNORMAL HIGH (ref 4.6–6.5)

## 2022-12-13 LAB — VITAMIN D 25 HYDROXY (VIT D DEFICIENCY, FRACTURES): VITD: 21.2 ng/mL — ABNORMAL LOW (ref 30.00–100.00)

## 2022-12-13 MED ORDER — MELOXICAM 15 MG PO TABS: 15.0000 mg | ORAL_TABLET | Freq: Every day | ORAL | 3 refills | Status: DC

## 2022-12-13 NOTE — Patient Instructions (Signed)
Thanks for your effort.  Go to the lab on the way out.   If you have mychart we'll likely use that to update you.    Take care.  Glad to see you. 

## 2022-12-13 NOTE — Progress Notes (Signed)
Diabetes: no meds.  Labs pending.  Sig upheaval at home. Husband is ill and dying.  D/w pt.  They are making plans re: palliative care vs hospice.  She isn't sleeping well.  PHQ-9 and GAD screening discussed with patient.  Support offered.  She declined medication treatment for grief at this point.  Still okay for outpatient follow-up.  Discussed potentially using metformin based on her labs.  Done with vit D replacement.  Recheck pending.  See notes on labs.  She needed refill on meloxicam.  No ADE on med.  Nsaid cautions d/w pt.    Meds, vitals, and allergies reviewed.   ROS: Per HPI unless specifically indicated in ROS section   GEN: nad, alert and oriented, tearful but regains composure. HEENT: ncat NECK: supple w/o LA CV: rrr. PULM: ctab, no inc wob ABD: soft, +bs EXT: no edema SKIN: well perfused.

## 2022-12-15 ENCOUNTER — Other Ambulatory Visit: Payer: Self-pay | Admitting: Family Medicine

## 2022-12-15 DIAGNOSIS — E119 Type 2 diabetes mellitus without complications: Secondary | ICD-10-CM

## 2022-12-15 DIAGNOSIS — E559 Vitamin D deficiency, unspecified: Secondary | ICD-10-CM

## 2022-12-15 MED ORDER — VITAMIN D (ERGOCALCIFEROL) 1.25 MG (50000 UNIT) PO CAPS: 50000.0000 [IU] | ORAL_CAPSULE | ORAL | 0 refills | Status: DC

## 2022-12-15 MED ORDER — METFORMIN HCL 500 MG PO TABS: 500.0000 mg | ORAL_TABLET | Freq: Every day | ORAL | 1 refills | Status: DC

## 2022-12-15 NOTE — Assessment & Plan Note (Signed)
See notes on labs. 

## 2022-12-15 NOTE — Assessment & Plan Note (Signed)
No medication yet.  Significant social upheaval affecting her diet, exercise, sleep.  See notes on labs.  Support offered.

## 2023-03-15 ENCOUNTER — Ambulatory Visit (INDEPENDENT_AMBULATORY_CARE_PROVIDER_SITE_OTHER): Payer: Medicare Other | Admitting: Family Medicine

## 2023-03-15 ENCOUNTER — Encounter: Payer: Self-pay | Admitting: Family Medicine

## 2023-03-15 VITALS — BP 132/80 | HR 81 | Temp 98.1°F | Ht 65.0 in | Wt 232.0 lb

## 2023-03-15 DIAGNOSIS — E559 Vitamin D deficiency, unspecified: Secondary | ICD-10-CM | POA: Diagnosis not present

## 2023-03-15 DIAGNOSIS — E119 Type 2 diabetes mellitus without complications: Secondary | ICD-10-CM | POA: Diagnosis not present

## 2023-03-15 DIAGNOSIS — Z7984 Long term (current) use of oral hypoglycemic drugs: Secondary | ICD-10-CM

## 2023-03-15 LAB — HEMOGLOBIN A1C: Hgb A1c MFr Bld: 8.2 % — ABNORMAL HIGH (ref 4.6–6.5)

## 2023-03-15 LAB — MICROALBUMIN / CREATININE URINE RATIO
Creatinine,U: 113.6 mg/dL
Microalb Creat Ratio: 3.6 mg/g (ref 0.0–30.0)
Microalb, Ur: 4.1 mg/dL — ABNORMAL HIGH (ref 0.0–1.9)

## 2023-03-15 LAB — VITAMIN D 25 HYDROXY (VIT D DEFICIENCY, FRACTURES): VITD: 45.63 ng/mL (ref 30.00–100.00)

## 2023-03-15 NOTE — Progress Notes (Unsigned)
Diabetes:  Using medications without difficulties: metformin 500mg  a day, occ GI upset, diarrhea but not daily. Unclear if med related.   Hypoglycemic episodes: no sx  Hyperglycemic episodes: no sx Feet problems: no Blood Sugars averaging:not checked.   eye exam within last year: 09/2022.  Dr. Nile Riggs.  No retinopathy.   Labs d/w pt.    H/o vit D def.  On replacement, recheck labs pending.    Palliative care coming in for her husband.  He is weaker, d/w pt. she is caring for him at home.  Support offered.  She will update me as needed.  Meds, vitals, and allergies reviewed.   ROS: Per HPI unless specifically indicated in ROS section   GEN: nad, alert and oriented HEENT: ncat NECK: supple w/o LA CV: rrr. PULM: ctab, no inc wob ABD: soft, +bs EXT: no edema SKIN: no acute rash  Diabetic foot exam: Normal inspection No skin breakdown No calluses  Normal DP pulses Normal sensation to light touch and monofilament Nails normal

## 2023-03-15 NOTE — Patient Instructions (Addendum)
Go to the lab on the way out.   If you have mychart we'll likely use that to update you.    ?Take care.  Glad to see you. ?Don't change your meds for now.  ?

## 2023-03-16 NOTE — Assessment & Plan Note (Signed)
H/o vit D def.  On replacement, recheck labs pending.

## 2023-03-16 NOTE — Assessment & Plan Note (Signed)
She is trying to manage her diet and exercise given the fact that she is caring for her husband at home.  See notes on labs.  Continue metformin as is for now.

## 2023-03-17 ENCOUNTER — Other Ambulatory Visit: Payer: Self-pay | Admitting: Family Medicine

## 2023-03-17 MED ORDER — METFORMIN HCL 500 MG PO TABS: 500.0000 mg | ORAL_TABLET | Freq: Two times a day (BID) | ORAL | 1 refills | Status: DC

## 2023-05-16 DIAGNOSIS — M8588 Other specified disorders of bone density and structure, other site: Secondary | ICD-10-CM | POA: Diagnosis not present

## 2023-05-16 DIAGNOSIS — Z1231 Encounter for screening mammogram for malignant neoplasm of breast: Secondary | ICD-10-CM | POA: Diagnosis not present

## 2023-05-16 LAB — HM MAMMOGRAPHY

## 2023-05-17 LAB — HM PAP SMEAR: HPV, high-risk: NEGATIVE

## 2023-06-01 DIAGNOSIS — D225 Melanocytic nevi of trunk: Secondary | ICD-10-CM | POA: Diagnosis not present

## 2023-06-01 DIAGNOSIS — L814 Other melanin hyperpigmentation: Secondary | ICD-10-CM | POA: Diagnosis not present

## 2023-06-01 DIAGNOSIS — Z86018 Personal history of other benign neoplasm: Secondary | ICD-10-CM | POA: Diagnosis not present

## 2023-06-01 DIAGNOSIS — L719 Rosacea, unspecified: Secondary | ICD-10-CM | POA: Diagnosis not present

## 2023-06-01 DIAGNOSIS — L578 Other skin changes due to chronic exposure to nonionizing radiation: Secondary | ICD-10-CM | POA: Diagnosis not present

## 2023-06-01 DIAGNOSIS — L820 Inflamed seborrheic keratosis: Secondary | ICD-10-CM | POA: Diagnosis not present

## 2023-06-01 DIAGNOSIS — L570 Actinic keratosis: Secondary | ICD-10-CM | POA: Diagnosis not present

## 2023-06-01 DIAGNOSIS — D2371 Other benign neoplasm of skin of right lower limb, including hip: Secondary | ICD-10-CM | POA: Diagnosis not present

## 2023-08-15 ENCOUNTER — Ambulatory Visit (INDEPENDENT_AMBULATORY_CARE_PROVIDER_SITE_OTHER): Payer: Medicare Other

## 2023-08-15 VITALS — Ht 65.0 in | Wt 232.0 lb

## 2023-08-15 DIAGNOSIS — Z0000 Encounter for general adult medical examination without abnormal findings: Secondary | ICD-10-CM

## 2023-08-15 NOTE — Patient Instructions (Signed)
Sharon Benjamin , Thank you for taking time to come for your Medicare Wellness Visit. I appreciate your ongoing commitment to your health goals. Please review the following plan we discussed and let me know if I can assist you in the future.   Referrals/Orders/Follow-Ups/Clinician Recommendations: none  This is a list of the screening recommended for you and due dates:  Health Maintenance  Topic Date Due   Mammogram  03/12/2023   Yearly kidney function blood test for diabetes  08/28/2023   Hemoglobin A1C  09/14/2023   Eye exam for diabetics  10/27/2023   DTaP/Tdap/Td vaccine (2 - Td or Tdap) 11/30/2023   Yearly kidney health urinalysis for diabetes  03/14/2024   Complete foot exam   03/14/2024   Medicare Annual Wellness Visit  08/14/2024   Colon Cancer Screening  02/02/2026   Pneumonia Vaccine  Completed   Flu Shot  Completed   DEXA scan (bone density measurement)  Completed   Hepatitis C Screening  Completed   Zoster (Shingles) Vaccine  Completed   HPV Vaccine  Aged Out   COVID-19 Vaccine  Discontinued    Advanced directives: (Copy Requested) Please bring a copy of your health care power of attorney and living will to the office to be added to your chart at your convenience.  Next Medicare Annual Wellness Visit scheduled for next year: Yes 08/16/24 @10 :50am telephone

## 2023-08-15 NOTE — Progress Notes (Signed)
Subjective:   Sharon Benjamin is a 67 y.o. female who presents for Medicare Annual (Subsequent) preventive examination.  Visit Complete: Virtual I connected with  [name] on 08/15/23 by a audio enabled telemedicine application and verified that I am speaking with the correct person using two identifiers.  Patient Location: Home  Provider Location: Home Office  I discussed the limitations of evaluation and management by telemedicine. The patient expressed understanding and agreed to proceed.  Vital Signs: Because this visit was a virtual/telehealth visit, some criteria may be missing or patient reported. Any vitals not documented were not able to be obtained and vitals that have been documented are patient reported.  Patient Medicare AWV questionnaire was completed by the patient on (not done); I have confirmed that all information answered by patient is correct and no changes since this date. Cardiac Risk Factors include: advanced age (>52men, >59 women);diabetes mellitus;dyslipidemia;obesity (BMI >30kg/m2);sedentary lifestyle    Objective:    Today's Vitals   08/15/23 1018 08/15/23 1019  Weight: 232 lb (105.2 kg)   Height: 5\' 5"  (1.651 m)   PainSc:  2    Body mass index is 38.61 kg/m.     08/15/2023   10:30 AM 08/11/2022   10:37 AM 10/15/2021    1:44 PM 08/07/2021   10:29 AM 03/26/2021    6:16 PM 08/05/2020    9:50 AM 02/03/2016    8:49 AM  Advanced Directives  Does Patient Have a Medical Advance Directive? Yes No No No No No No  Type of Estate agent of [place];Living will        Copy of Healthcare Power of Attorney in Chart? No - copy requested        Would patient like information on creating a medical advance directive?  No - Patient declined No - Patient declined Yes (MAU/Ambulatory/Procedural Areas - Information given)  No - Patient declined     Current Medications (verified) Outpatient Encounter Medications as of 08/15/2023   Medication Sig   fexofenadine (ALLEGRA) 180 MG tablet Take 180 mg by mouth daily as needed.    lansoprazole (PREVACID) 15 MG capsule Take 1 capsule (15 mg total) by mouth daily as needed.   levothyroxine (SYNTHROID) 88 MCG tablet Take 1 tablet (88 mcg total) by mouth daily.   meloxicam (MOBIC) 15 MG tablet Take 1 tablet (15 mg total) by mouth daily. With food.   metFORMIN (GLUCOPHAGE) 500 MG tablet Take 1 tablet (500 mg total) by mouth 2 (two) times daily with a meal.   simvastatin (ZOCOR) 20 MG tablet TAKE 1 TABLET BY MOUTH EVERYDAY AT BEDTIME   Vitamin D, Ergocalciferol, (DRISDOL) 1.25 MG (50000 UNIT) CAPS capsule Take 1 capsule (50,000 Units total) by mouth 2 (two) times a week.   No facility-administered encounter medications on file as of 08/15/2023.    Allergies (verified) Lipitor [atorvastatin]   History: Past Medical History:  Diagnosis Date   Allergy    Arthritis    Cataract    bi;lateral   Diabetes mellitus without complication (HCC)    Diverticulosis of colon    Gallstones    GERD (gastroesophageal reflux disease)    Hyperlipidemia    Insomnia    with prn ambien use, rare as of 2013   Nephrolithiasis    Thyroid disease    Past Surgical History:  Procedure Laterality Date   Abd sonogram - left renal cyst, GB polyp  03/96   ABDOMINAL HYSTERECTOMY     CATARACT  EXTRACTION Bilateral    COLONOSCOPY     CT abd - left renal cyst only  03/96   Left knee arthroscopy  02/2005   TONSILLECTOMY AND ADENOIDECTOMY  Age 41   UGI - hiatal hernia  03/96   Family History  Problem Relation Age of Onset   Hyperlipidemia Mother    Hypertension Mother    Asthma Mother    Arthritis Mother        Psoriatic arthritis   Cancer Father        Esophagus, was a smoker and used ETOH   Alcohol abuse Father    Esophageal cancer Father    Heart disease Maternal Grandmother        MI   Diabetes Paternal Grandmother    Cancer Maternal Aunt        Ovarian   Heart disease Maternal Uncle         CHF   Cancer Paternal Aunt        Breast   Breast cancer Paternal Aunt    Cancer Paternal Aunt        Liver cancer   Cancer Paternal Uncle        Lung cancer   Cancer Paternal Uncle        Lung cancer   Heart disease Other        Angina   Colon cancer Neg Hx    Colon polyps Neg Hx    Rectal cancer Neg Hx    Stomach cancer Neg Hx    Social History   Socioeconomic History   Marital status: Married    Spouse name: Not on file   Number of children: 0   Years of education: Not on file   Highest education level: Not on file  Occupational History   Occupation: Agricultural engineer: [employer]  Tobacco Use   Smoking status: Former   Smokeless tobacco: Never   Tobacco comments:    quit over 30 years ago  Substance and Sexual Activity   Alcohol use: Yes    Alcohol/week: 0.0 standard drinks of alcohol    Comment: rarely   Drug use: No   Sexual activity: Not on file  Other Topics Concern   Not on file  Social History Narrative   Retired as Airline pilot for [employer]   Married 1983   Social Determinants of Health   Financial Resource Strain: Low Risk  (08/15/2023)   Overall Financial Resource Strain (CARDIA)    Difficulty of Paying Living Expenses: Not hard at all  Food Insecurity: No Food Insecurity (08/15/2023)   Hunger Vital Sign    Worried About Running Out of Food in the Last Year: Never true    Ran Out of Food in the Last Year: Never true  Transportation Needs: No Transportation Needs (08/15/2023)   PRAPARE - Administrator, Civil Service (Medical): No    Lack of Transportation (Non-Medical): No  Physical Activity: Inactive (08/15/2023)   Exercise Vital Sign    Days of Exercise per Week: 0 days    Minutes of Exercise per Session: 0 min  Stress: Stress Concern Present (08/15/2023)   [employer] of Occupational Health - Occupational Stress Questionnaire    Feeling of Stress : Rather much  Social Connections: Socially  Isolated (08/15/2023)   Social Connection and Isolation Panel [NHANES]    Frequency of Communication with Friends and Family: Once a week    Frequency of Social Gatherings with Friends and Family:  Once a week    Attends Religious Services: Never    Active Member of Clubs or Organizations: No    Attends Banker Meetings: Never    Marital Status: Married    Tobacco Counseling Counseling given: Not Answered Tobacco comments: quit over 30 years ago   Clinical Intake:  Pre-visit preparation completed: Yes  Pain : 0-10 Pain Score: 2  Pain Type: Chronic pain Pain Location: Back (both knees) Pain Orientation: Lower Pain Radiating Towards: down her legs Pain Descriptors / Indicators: Aching, Radiating Pain Onset: More than a month ago Pain Frequency: Constant Pain Relieving Factors: stretches, meloxicam  Pain Relieving Factors: stretches, meloxicam  BMI - recorded: 38.61 Nutritional Status: BMI > 30  Obese Nutritional Risks: None Diabetes: Yes CBG done?: No Did pt. bring in CBG monitor from home?: No  How often do you need to have someone help you when you read instructions, pamphlets, or other written materials from your doctor or pharmacy?: 1 - Never  Interpreter Needed?: No  Comments: lives alone:jusband passed last month Information entered by :: B.Baylyn Sickles,LPN   Activities of Daily Living    08/15/2023   10:31 AM  In your present state of health, do you have any difficulty performing the following activities:  Hearing? 0  Vision? 0  Difficulty concentrating or making decisions? 0  Walking or climbing stairs? 0  Dressing or bathing? 0  Doing errands, shopping? 0  Preparing Food and eating ? N  Using the Toilet? N  In the past six months, have you accidently leaked urine? N  Do you have problems with loss of bowel control? N  Managing your Medications? N  Managing your Finances? N  Housekeeping or managing your Housekeeping? N    Patient Care  Team: Joaquim Nam, MD as PCP - General (Family Medicine) Riki Altes, MD (Urology) Zelphia Cairo, MD as Consulting Physician (Obstetrics and Gynecology) [facility]  Indicate any recent Medical Services you may have received from other than [facility] providers in the past year (date may be approximate).     Assessment:   This is a routine wellness examination for Jakaia.  Hearing/Vision screen Hearing Screening - Comments:: Pt says her hearing is good with hearing aids Vision Screening - Comments:: Pt says vision is good; Dr Dione Booze   Goals Addressed             This Visit's Progress    DIET - EAT MORE FRUITS AND VEGETABLES   Not on track    COMPLETED: Patient Stated   On track    08/05/2020, I will maintain and continue medications as prescribed.      Patient Stated   Not on track    Would like to maintain being more active.      Depression Screen    08/15/2023   10:27 AM 03/15/2023   10:35 AM 12/13/2022   10:32 AM 08/11/2022   10:35 AM 08/12/2020    9:16 AM 08/05/2020    9:52 AM 09/17/2019    3:21 PM  PHQ 2/9 Scores  PHQ - 2 Score 2 2 6 1  0 0 0  PHQ- 9 Score 6 8 16 4  0 0     Fall Risk    08/15/2023   10:23 AM 03/15/2023   10:35 AM 12/13/2022   10:32 AM 08/11/2022   10:38 AM 08/07/2021   10:30 AM  Fall Risk   Falls in the past year? 0 0 0 0 0  Number  falls in past yr: 0 0 0 0 0  Injury with Fall? 0 0 0 0 0  Risk for fall due to : No Fall Risks No Fall Risks No Fall Risks No Fall Risks No Fall Risks  Follow up Education provided;Falls prevention discussed Falls evaluation completed Falls evaluation completed Falls prevention discussed;Falls evaluation completed Falls prevention discussed    MEDICARE RISK AT HOME: Medicare Risk at Home Any stairs in or around the home?: Yes (outside) If so, are there any without handrails?: Yes Home free of loose throw rugs in walkways, pet beds, electrical cords, etc?: Yes Adequate lighting in your  home to reduce risk of falls?: Yes Life alert?: No Use of a cane, walker or w/c?: No Grab bars in the bathroom?: Yes Shower chair or bench in shower?: No Elevated toilet seat or a handicapped toilet?: No  TIMED UP AND GO:  Was the test performed?  No    Cognitive Function:    08/05/2020    9:55 AM  MMSE - Mini Mental State Exam  Orientation to time 5  Orientation to Place 5  Registration 3  Attention/ Calculation 5  Recall 3  Language- repeat 1        08/15/2023   10:32 AM 08/11/2022   10:38 AM  6CIT Screen  What Year? 0 points 0 points  What month? 0 points 0 points  What time? 0 points 0 points  Count back from 20 0 points 0 points  Months in reverse 0 points 0 points  Repeat phrase 0 points 0 points  Total Score 0 points 0 points    Immunizations Immunization History  Administered Date(s) Administered   Fluad Quad(high Dose 65+) 07/07/2020, 09/03/2022   Influenza, High Dose Seasonal PF 07/20/2021, 06/30/2023   Influenza,inj,Quad PF,6+ Mos 07/25/2017, 08/11/2018, 06/12/2019   Influenza-Unspecified 07/06/2012   PFIZER(Purple Top)SARS-COV-2 Vaccination 11/04/2019, 11/28/2019, 06/23/2020   PNEUMOCOCCAL CONJUGATE-20 09/01/2021   Pneumococcal Polysaccharide-23 09/17/2019   Tdap 11/29/2013   Zoster Recombinant(Shingrix) 06/12/2019, 08/16/2019    TDAP status: Up to date  Flu Vaccine status: Up to date  Pneumococcal vaccine status: Up to date  Covid-19 vaccine status: Completed vaccines  Qualifies for Shingles Vaccine? Yes   Zostavax completed Yes   Shingrix Completed?: Yes  Screening Tests Health Maintenance  Topic Date Due   Diabetic kidney evaluation - eGFR measurement  08/28/2023   MAMMOGRAM  10/27/2024 (Originally 03/12/2023)   HEMOGLOBIN A1C  09/14/2023   OPHTHALMOLOGY EXAM  10/27/2023   DTaP/Tdap/Td (2 - Td or Tdap) 11/30/2023   Diabetic kidney evaluation - Urine ACR  03/14/2024   FOOT EXAM  03/14/2024   Medicare Annual Wellness (AWV)   08/14/2024   Colonoscopy  02/02/2026   Pneumonia Vaccine 56+ Years old  Completed   INFLUENZA VACCINE  Completed   DEXA SCAN  Completed   Hepatitis C Screening  Completed   Zoster Vaccines- Shingrix  Completed   HPV VACCINES  Aged Out   COVID-19 Vaccine  Discontinued    Health Maintenance  Health Maintenance Due  Topic Date Due   Diabetic kidney evaluation - eGFR measurement  08/28/2023    Colorectal cancer screening: Type of screening: Colonoscopy. Completed 02/03/2016. Repeat every 10 years  Mammogram status: Completed 04/2023 per pt. Repeat every year  Bone Density status: Completed 04/2023 per pt. Results reflect: Bone density results: NORMAL. Repeat every 5 years.  Lung Cancer Screening: (Low Dose CT Chest recommended if Age 33-80 years, 20 pack-year currently smoking OR have quit w/in  15years.) does not qualify.   Lung Cancer Screening Referral: no  Additional Screening:  Hepatitis C Screening: does not qualify; Completed yes  Vision Screening: Recommended annual ophthalmology exams for early detection of glaucoma and other disorders of the eye. Is the patient up to date with their annual eye exam?  Yes  Who is the provider or what is the name of the office in which the patient attends annual eye exams? Dr Dione Booze If pt is not established with a provider, would they like to be referred to a provider to establish care? No .   Dental Screening: Recommended annual dental exams for proper oral hygiene  Diabetic Foot Exam: Diabetic Foot Exam: Completed 03/15/23  Community Resource Referral / Chronic Care Management: CRR required this visit?  No   CCM required this visit?  No   Plan:     I have personally reviewed and noted the following in the patient's chart:   Medical and social history Use of alcohol, tobacco or illicit drugs  Current medications and supplements including opioid prescriptions. Patient is not currently taking opioid prescriptions. Functional ability  and status Nutritional status Physical activity Advanced directives List of other physicians Hospitalizations, surgeries, and ER visits in previous 12 months Vitals Screenings to include cognitive, depression, and falls Referrals and appointments  In addition, I have reviewed and discussed with patient certain preventive protocols, quality metrics, and best practice recommendations. A written personalized care plan for preventive services as well as general preventive health recommendations were provided to patient.   Sue Lush, LPN   [id number]   After Visit Summary: (MyChart) Due to this being a telephonic visit, the after visit summary with patients personalized plan was offered to patient via MyChart   Nurse Notes: Pt relays dealing with the death of her husband last month. She relays she does not know "how she feels". She relays she is scheduling appt for grief counseling through hospice. She has no concerns or questions at this time.

## 2023-09-11 ENCOUNTER — Other Ambulatory Visit: Payer: Self-pay | Admitting: Family Medicine

## 2023-09-11 DIAGNOSIS — E559 Vitamin D deficiency, unspecified: Secondary | ICD-10-CM

## 2023-09-11 DIAGNOSIS — E119 Type 2 diabetes mellitus without complications: Secondary | ICD-10-CM

## 2023-09-19 ENCOUNTER — Other Ambulatory Visit: Payer: Medicare Other

## 2023-09-19 DIAGNOSIS — E119 Type 2 diabetes mellitus without complications: Secondary | ICD-10-CM | POA: Diagnosis not present

## 2023-09-19 DIAGNOSIS — Z7984 Long term (current) use of oral hypoglycemic drugs: Secondary | ICD-10-CM

## 2023-09-19 DIAGNOSIS — E559 Vitamin D deficiency, unspecified: Secondary | ICD-10-CM

## 2023-09-19 LAB — COMPREHENSIVE METABOLIC PANEL
ALT: 24 U/L (ref 0–35)
AST: 18 U/L (ref 0–37)
Albumin: 4.3 g/dL (ref 3.5–5.2)
Alkaline Phosphatase: 120 U/L — ABNORMAL HIGH (ref 39–117)
BUN: 20 mg/dL (ref 6–23)
CO2: 32 meq/L (ref 19–32)
Calcium: 10.5 mg/dL (ref 8.4–10.5)
Chloride: 99 meq/L (ref 96–112)
Creatinine, Ser: 0.85 mg/dL (ref 0.40–1.20)
GFR: 70.09 mL/min (ref >60.00)
Glucose, Bld: 149 mg/dL — ABNORMAL HIGH (ref 70–99)
Potassium: 4.6 meq/L (ref 3.5–5.1)
Sodium: 140 meq/L (ref 135–145)
Total Bilirubin: 0.8 mg/dL (ref 0.2–1.2)
Total Protein: 6.8 g/dL (ref 6.0–8.3)

## 2023-09-19 LAB — LIPID PANEL
Cholesterol: 201 mg/dL — ABNORMAL HIGH (ref 0–200)
HDL: 39 mg/dL — ABNORMAL LOW (ref >39.00)
LDL Cholesterol: 125 mg/dL — ABNORMAL HIGH (ref 0–99)
NonHDL: 162.46
Total CHOL/HDL Ratio: 5
Triglycerides: 185 mg/dL — ABNORMAL HIGH (ref 0.0–149.0)
VLDL: 37 mg/dL (ref 0.0–40.0)

## 2023-09-19 LAB — HEMOGLOBIN A1C: Hgb A1c MFr Bld: 7.1 % — ABNORMAL HIGH (ref 4.6–6.5)

## 2023-09-19 LAB — TSH: TSH: 3.56 u[IU]/mL (ref 0.35–5.50)

## 2023-09-19 LAB — VITAMIN D 25 HYDROXY (VIT D DEFICIENCY, FRACTURES): VITD: 29.64 ng/mL — ABNORMAL LOW (ref 30.00–100.00)

## 2023-09-26 ENCOUNTER — Ambulatory Visit: Payer: Medicare Other | Admitting: Family Medicine

## 2023-09-26 ENCOUNTER — Encounter: Payer: Self-pay | Admitting: Family Medicine

## 2023-09-26 VITALS — BP 142/78 | HR 81 | Temp 99.0°F | Ht 65.0 in | Wt 226.2 lb

## 2023-09-26 DIAGNOSIS — E785 Hyperlipidemia, unspecified: Secondary | ICD-10-CM

## 2023-09-26 DIAGNOSIS — E890 Postprocedural hypothyroidism: Secondary | ICD-10-CM

## 2023-09-26 DIAGNOSIS — E119 Type 2 diabetes mellitus without complications: Secondary | ICD-10-CM | POA: Diagnosis not present

## 2023-09-26 DIAGNOSIS — Z7189 Other specified counseling: Secondary | ICD-10-CM

## 2023-09-26 DIAGNOSIS — Z7984 Long term (current) use of oral hypoglycemic drugs: Secondary | ICD-10-CM

## 2023-09-26 DIAGNOSIS — E559 Vitamin D deficiency, unspecified: Secondary | ICD-10-CM

## 2023-09-26 DIAGNOSIS — Z0000 Encounter for general adult medical examination without abnormal findings: Secondary | ICD-10-CM

## 2023-09-26 DIAGNOSIS — F4321 Adjustment disorder with depressed mood: Secondary | ICD-10-CM

## 2023-09-26 MED ORDER — METFORMIN HCL ER 500 MG PO TB24: 500.0000 mg | ORAL_TABLET | Freq: Every day | ORAL | 3 refills | Status: DC

## 2023-09-26 MED ORDER — SIMVASTATIN 20 MG PO TABS: ORAL_TABLET | ORAL | 3 refills | Status: DC

## 2023-09-26 MED ORDER — LEVOTHYROXINE SODIUM 88 MCG PO TABS: 88.0000 ug | ORAL_TABLET | Freq: Every day | ORAL | 3 refills | Status: DC

## 2023-09-26 MED ORDER — VITAMIN D3 50 MCG (2000 UT) PO CAPS: 2000.0000 [IU] | ORAL_CAPSULE | Freq: Every day | ORAL | Status: DC

## 2023-09-26 NOTE — Patient Instructions (Addendum)
Try taking vitamin D daily.  Try changing to XR metformin 1 a day.  Let me know if that isn't easier to tolerate.  Take care.  Glad to see you. Recheck in about 4 months with A1c at the visit.   Please ask the front for a record release for your mammogram and DXA through [facility].

## 2023-09-27 NOTE — Assessment & Plan Note (Signed)
 Flu 2024 Shingles previously done PNA 2022 Tetanus 2015 covid vaccine 2021 Colon cancer screening with colonoscopy 2017 Breast cancer screening 2024 per gynecology.   [facility].   Advance directive- Granddaughter Cristopher Bloch designated if patient were incapacitated.

## 2023-09-27 NOTE — Assessment & Plan Note (Signed)
 Compliant.  TSH wnl.  Labs d/w pt.  Continue levothyroxine.

## 2023-09-27 NOTE — Assessment & Plan Note (Signed)
Continue simvastatin. D/w pt about diet and exercise.

## 2023-09-27 NOTE — Assessment & Plan Note (Signed)
 She had GI upset with 1 metformin per day, worse with 2 per day.  D/w pt about XR metformin trial.   Recheck in about 4 months with A1c at the visit.

## 2023-09-27 NOTE — Assessment & Plan Note (Signed)
 Advance directive- Granddaughter Shelbie Ammons designated if patient were incapacitated.

## 2023-09-27 NOTE — Assessment & Plan Note (Signed)
 Her husband died in October.  He was on hospice.  Condolences offered.  Went to [employer] for breakfast on Christmas, then went to see her grandkid/great grandkids.  Sleep is disrupted.  Grief d/w pt.  She is going to grief counseling with Hospice.

## 2023-09-27 NOTE — Assessment & Plan Note (Signed)
 D/w pt about taking vitamin D daily.

## 2023-10-31 DIAGNOSIS — E119 Type 2 diabetes mellitus without complications: Secondary | ICD-10-CM | POA: Diagnosis not present

## 2023-10-31 DIAGNOSIS — H04123 Dry eye syndrome of bilateral lacrimal glands: Secondary | ICD-10-CM | POA: Diagnosis not present

## 2023-10-31 DIAGNOSIS — Z961 Presence of intraocular lens: Secondary | ICD-10-CM | POA: Diagnosis not present

## 2023-10-31 LAB — HM DIABETES EYE EXAM

## 2023-11-08 ENCOUNTER — Other Ambulatory Visit: Payer: Self-pay | Admitting: Family Medicine

## 2023-11-08 NOTE — Telephone Encounter (Signed)
Last office visit: 09/26/23 Next office visit: 01/26/24 Last refill:  MELOXICAM 15 MG TABLET 12/13/22 90 tablets 3 refills

## 2023-12-04 ENCOUNTER — Telehealth: Payer: Self-pay | Admitting: Family Medicine

## 2023-12-04 DIAGNOSIS — E119 Type 2 diabetes mellitus without complications: Secondary | ICD-10-CM

## 2023-12-04 NOTE — Telephone Encounter (Signed)
 Please update patient- they will get a letter/mychart message about this.    The hospital system discovered a software error in a system at the laboratory at [facility] at [address] in [place] that examines kidney function. The patient's results were incorrectly calculated on this test.  The test is called uACR. It measures the amount of albumin (a protein) in the urine relative to creatinine (a waste product). This test is one of several factors used to judge kidney function. The laboratory software miscalculated the ratio of one to the other. The measurements themselves were correct. The software error has been fixed.  I went back and checked the patient's lab results and medicines.    At this point, it does not appear that the patient needs to make any medicine changes.  I would like to recheck her labs at the next visit.   We may need to make changes at that point, depending on her labs.   If the patient has any questions or concerns about this, please schedule a visit/let me know.   Thanks.

## 2023-12-06 NOTE — Telephone Encounter (Signed)
 Unable to reach patient. Left voicemail to return call to our office.

## 2023-12-07 ENCOUNTER — Encounter: Payer: Self-pay | Admitting: Family Medicine

## 2024-01-26 ENCOUNTER — Encounter: Payer: Self-pay | Admitting: Family Medicine

## 2024-01-26 ENCOUNTER — Ambulatory Visit: Payer: Medicare Other | Admitting: Family Medicine

## 2024-01-26 VITALS — BP 130/80 | HR 77 | Temp 98.8°F | Ht 65.0 in | Wt 227.0 lb

## 2024-01-26 DIAGNOSIS — E119 Type 2 diabetes mellitus without complications: Secondary | ICD-10-CM

## 2024-01-26 LAB — POCT GLYCOSYLATED HEMOGLOBIN (HGB A1C): Hemoglobin A1C: 6.9 % — AB (ref 4.0–5.6)

## 2024-01-26 LAB — MICROALBUMIN / CREATININE URINE RATIO
Creatinine,U: 167.8 mg/dL
Microalb Creat Ratio: 13.2 mg/g (ref 0.0–30.0)
Microalb, Ur: 2.2 mg/dL — ABNORMAL HIGH (ref 0.0–1.9)

## 2024-01-26 NOTE — Progress Notes (Unsigned)
 Diabetes:  Using medications without difficulties: yes Hypoglycemic episodes:no sx Hyperglycemic episodes:no sx Feet problems: no Blood Sugars averaging: no eye exam within last year: yes A1c improved.   She is trying to "cook for one" discussed.  She is trying to manage leftovers.  Prev MALB d/w pt.  Recheck pending.  Not yet on ACE, ACE cautions d/w pt.    Discussed her situation.  Grief d/w pt.  She is going to the beach next week with friends.  D/w pt.    Meds, vitals, and allergies reviewed.  ROS: Per HPI unless specifically indicated in ROS section   GEN: nad, alert and oriented HEENT: mucous membranes moist NECK: supple w/o LA CV: rrr. PULM: ctab, no inc wob ABD: soft, +bs EXT: no edema SKIN: no acute rash  Diabetic foot exam: Normal inspection No skin breakdown No calluses  Normal DP pulses Normal sensation to light touch and monofilament Nails normal

## 2024-01-26 NOTE — Patient Instructions (Addendum)
 Go to the lab on the way out.   If you have mychart we'll likely use that to update you.    Take care.  Glad to see you. Recheck in about 4 months.  A1c at the visit.

## 2024-01-29 ENCOUNTER — Encounter: Payer: Self-pay | Admitting: Family Medicine

## 2024-01-29 NOTE — Assessment & Plan Note (Signed)
 A1c improved.   She is trying to "cook for one" discussed.  She is trying to manage leftovers.  Prev MALB d/w pt.  Recheck pending.  Not yet on ACE, ACE cautions d/w pt.   Recheck in about 4 months.  A1c at the visit. Continue metformin .

## 2024-05-29 ENCOUNTER — Ambulatory Visit: Admitting: Family Medicine

## 2024-06-01 DIAGNOSIS — L814 Other melanin hyperpigmentation: Secondary | ICD-10-CM | POA: Diagnosis not present

## 2024-06-01 DIAGNOSIS — L719 Rosacea, unspecified: Secondary | ICD-10-CM | POA: Diagnosis not present

## 2024-06-01 DIAGNOSIS — D225 Melanocytic nevi of trunk: Secondary | ICD-10-CM | POA: Diagnosis not present

## 2024-06-01 DIAGNOSIS — L723 Sebaceous cyst: Secondary | ICD-10-CM | POA: Diagnosis not present

## 2024-06-01 DIAGNOSIS — Z86018 Personal history of other benign neoplasm: Secondary | ICD-10-CM | POA: Diagnosis not present

## 2024-06-01 DIAGNOSIS — L578 Other skin changes due to chronic exposure to nonionizing radiation: Secondary | ICD-10-CM | POA: Diagnosis not present

## 2024-06-01 DIAGNOSIS — D2371 Other benign neoplasm of skin of right lower limb, including hip: Secondary | ICD-10-CM | POA: Diagnosis not present

## 2024-06-04 ENCOUNTER — Ambulatory Visit: Admitting: Family Medicine

## 2024-06-04 ENCOUNTER — Encounter: Payer: Self-pay | Admitting: Family Medicine

## 2024-06-04 VITALS — BP 138/84 | HR 76 | Temp 98.7°F | Ht 65.0 in | Wt 225.2 lb

## 2024-06-04 DIAGNOSIS — E119 Type 2 diabetes mellitus without complications: Secondary | ICD-10-CM

## 2024-06-04 DIAGNOSIS — Z7984 Long term (current) use of oral hypoglycemic drugs: Secondary | ICD-10-CM | POA: Diagnosis not present

## 2024-06-04 LAB — POCT GLYCOSYLATED HEMOGLOBIN (HGB A1C): Hemoglobin A1C: 6.8 % — AB (ref 4.0–5.6)

## 2024-06-04 NOTE — Progress Notes (Unsigned)
 Diabetes:  Using medications without difficulties: some occ diarrhea but less than prior.  She didn't tolerate 1000mg  dosing.  Hypoglycemic episodes: no Hyperglycemic episodes: no  Feet problems: no Blood Sugars averaging: not checked.   eye exam within last year: yes A1c improved, below 7.   Diet and exercise d/w pt.  Cooking situation/considerations d/w pt.   Meds, vitals, and allergies reviewed.   ROS: Per HPI unless specifically indicated in ROS section   GEN: nad, alert and oriented HEENT: mucous membranes moist NECK: supple w/o LA CV: rrr. PULM: ctab, no inc wob ABD: soft, +bs EXT: no edema SKIN: no acute rash  Diabetic foot exam: Normal inspection No skin breakdown No calluses  Normal DP pulses Normal sensation to light touch and monofilament Nails normal

## 2024-06-04 NOTE — Patient Instructions (Addendum)
 You could try taking metformin  at night and see if that is easier to tolerate.  Take care.  Glad to see you[facility]  Recheck labs before a visit in 09/2024.   I would get a flu shot each fall.

## 2024-06-06 DIAGNOSIS — Z1231 Encounter for screening mammogram for malignant neoplasm of breast: Secondary | ICD-10-CM | POA: Diagnosis not present

## 2024-06-06 NOTE — Assessment & Plan Note (Signed)
 A1c improved, below 7.   Diet and exercise d/w pt.  Cooking situation/considerations d/w pt.   She could try taking metformin  at night and see if that is easier to tolerate. [facility]  Recheck labs before a visit in 09/2024.

## 2024-07-24 ENCOUNTER — Telehealth: Payer: Self-pay | Admitting: Family Medicine

## 2024-07-24 NOTE — Telephone Encounter (Signed)
 Copied from CRM [id number]. Topic: General - Billing Inquiry >> Jul 24, 2024 10:39 AM Ashley SAUNDERS wrote: Reason for CRM: Provider credentials update requested by [facility] for Cleatus, MD Listed as Landy Stains currently [employer] [phone]

## 2024-08-16 ENCOUNTER — Ambulatory Visit: Payer: Medicare Other

## 2024-08-16 VITALS — Ht 65.0 in | Wt 225.0 lb

## 2024-08-16 DIAGNOSIS — Z0000 Encounter for general adult medical examination without abnormal findings: Secondary | ICD-10-CM

## 2024-08-16 NOTE — Progress Notes (Signed)
 Chief Complaint  Patient presents with   Medicare Wellness     Subjective:   Sharon Benjamin is a 68 y.o. female who presents for a Medicare Annual Wellness Visit.  Allergies (verified) Lipitor [atorvastatin] and Metformin  and related   History: Past Medical History:  Diagnosis Date   Allergy    Arthritis    Cataract    bi;lateral   Diabetes mellitus without complication (HCC)    Diverticulosis of colon    Gallstones    GERD (gastroesophageal reflux disease)    Hyperlipidemia    Insomnia    with prn ambien use, rare as of 2013   Nephrolithiasis    Thyroid  disease    Past Surgical History:  Procedure Laterality Date   Abd sonogram - left renal cyst, GB polyp  03/96   ABDOMINAL HYSTERECTOMY     CATARACT EXTRACTION Bilateral    COLONOSCOPY     CT abd - left renal cyst only  03/96   Left knee arthroscopy  02/2005   TONSILLECTOMY AND ADENOIDECTOMY  Age 54   UGI - hiatal hernia  03/96   Family History  Problem Relation Age of Onset   Hyperlipidemia Mother    Hypertension Mother    Asthma Mother    Arthritis Mother        Psoriatic arthritis   Cancer Father        Esophagus, was a smoker and used ETOH   Alcohol abuse Father    Esophageal cancer Father    Heart disease Maternal Grandmother        MI   Diabetes Paternal Grandmother    Cancer Maternal Aunt        Ovarian   Heart disease Maternal Uncle        CHF   Cancer Paternal Aunt        Breast   Breast cancer Paternal Aunt    Cancer Paternal Aunt        Liver cancer   Cancer Paternal Uncle        Lung cancer   Cancer Paternal Uncle        Lung cancer   Heart disease Other        Angina   Colon cancer Neg Hx    Colon polyps Neg Hx    Rectal cancer Neg Hx    Stomach cancer Neg Hx    Social History   Occupational History   Occupation: Agricultural Engineer: [employer]  Tobacco Use   Smoking status: Former   Smokeless tobacco: Never   Tobacco comments:    quit over 30 years ago   Substance and Sexual Activity   Alcohol use: Yes    Alcohol/week: 0.0 standard drinks of alcohol    Comment: rarely   Drug use: No   Sexual activity: Not on file   Tobacco Counseling Counseling given: Not Answered Tobacco comments: quit over 30 years ago  SDOH Screenings   Food Insecurity: No Food Insecurity (08/16/2024)  Housing: Unknown (08/16/2024)  Transportation Needs: No Transportation Needs (08/16/2024)  Utilities: Not At Risk (08/16/2024)  Alcohol Screen: Low Risk  (08/16/2024)  Depression (PHQ2-9): Low Risk  (08/16/2024)  Recent Concern: Depression (PHQ2-9) - Medium Risk (06/04/2024)  Financial Resource Strain: Low Risk  (08/16/2024)  Physical Activity: Insufficiently Active (08/16/2024)  Social Connections: Moderately Isolated (08/16/2024)  Stress: Stress Concern Present (08/16/2024)  Tobacco Use: Medium Risk (08/16/2024)  Health Literacy: Patient Unable To Answer (08/16/2024)   See flowsheets for  full screening details  Depression Screen PHQ 2 & 9 Depression Scale- Over the past 2 weeks, how often have you been bothered by any of the following problems? Little interest or pleasure in doing things: 0 Feeling down, depressed, or hopeless (PHQ Adolescent also includes...irritable): 1 PHQ-2 Total Score: 1 Trouble falling or staying asleep, or sleeping too much: 3 Feeling tired or having little energy: 2 Poor appetite or overeating (PHQ Adolescent also includes...weight loss): 0 Feeling bad about yourself - or that you are a failure or have let yourself or your family down: 0 Trouble concentrating on things, such as reading the newspaper or watching television (PHQ Adolescent also includes...like school work): 1 Moving or speaking so slowly that other people could have noticed. Or the opposite - being so fidgety or restless that you have been moving around a lot more than usual: 0 Thoughts that you would be better off dead, or of hurting yourself in some way: 0 PHQ-9  Total Score: 10 If you checked off any problems, how difficult have these problems made it for you to do your work, take care of things at home, or get along with other people?: Somewhat difficult     Goals Addressed             This Visit's Progress    DIET - EAT MORE FRUITS AND VEGETABLES       Will continue     Patient Stated       08/16/24-Would like towill continue maintain being more active.       Visit info / Clinical Intake: Medicare Wellness Visit Type:: Subsequent Annual Wellness Visit Persons participating in visit:: patient Medicare Wellness Visit Mode:: Telephone If telephone:: video declined Because this visit was a virtual/telehealth visit:: unable to obtan vitals due to lack of equipment If Telephone or Video please confirm:: I discussed the limitations of evaluation and management by telemedicine Patient Location:: home Provider Location:: clinic Information given by:: patient Interpreter Needed?: No Pre-visit prep was completed: yes AWV questionnaire completed by patient prior to visit?: no Living arrangements:: (!) lives alone Patient's Overall Health Status Rating: very good Typical amount of pain: some Does pain affect daily life?: (!) yes Are you currently prescribed opioids?: no  Dietary Habits and Nutritional Risks How many meals a day?: 2 Eats fruit and vegetables daily?: (!) no Most meals are obtained by: preparing own meals; eating out In the last 2 weeks, have you had any of the following?: none Diabetic:: (!) yes Any non-healing wounds?: no How often do you check your BS?: 0 Would you like to be referred to a Nutritionist or for Diabetic Management? : no  Functional Status Activities of Daily Living (to include ambulation/medication): Independent Ambulation: Independent Medication Administration: Independent Home Management: Independent Manage your own finances?: yes Primary transportation is: driving Concerns about vision?: no  *vision screening is required for WTM* Concerns about hearing?: (!) yes Uses hearing aids?: (!) yes Hear whispered voice?: (!) no *in-person visit only*  Fall Screening Falls in the past year?: 0 Number of falls in past year: 0 Was there an injury with Fall?: 0 Fall Risk Category Calculator: 0 Patient Fall Risk Level: Low Fall Risk  Fall Risk Patient at Risk for Falls Due to: No Fall Risks Fall risk Follow up: Education provided; Falls prevention discussed  Home and Transportation Safety: All rugs have non-skid backing?: N/A, no rugs All stairs or steps have railings?: yes Grab bars in the bathtub or shower?: yes Have non-skid  surface in bathtub or shower?: (!) no Good home lighting?: yes Regular seat belt use?: yes Hospital stays in the last year:: no  Cognitive Assessment Difficulty concentrating, remembering, or making decisions? : no Will 6CIT or Mini Cog be Completed: yes What year is it?: 0 points What month is it?: 0 points Give patient an address phrase to remember (5 components): [address] [place]  About what time is it?: 0 points Count backwards from 20 to 1: 0 points Say the months of the year in reverse: 0 points Repeat the address phrase from earlier: 0 points 6 CIT Score: 0 points  Advance Directives (For Healthcare) Does Patient Have a Medical Advance Directive?: Yes Type of Advance Directive: Healthcare Power of [place]; Living will Copy of Healthcare Power of Attorney in Chart?: No - copy requested Copy of Living Will in Chart?: No - copy requested  Reviewed/Updated  Reviewed/Updated: Reviewed All (Medical, Surgical, Family, Medications, Allergies, Care Teams, Patient Goals)        Objective:    Today's Vitals   08/16/24 1050  Weight: 225 lb (102.1 kg)  Height: 5' 5 (1.651 m)   Body mass index is 37.44 kg/m.  Current Medications (verified) Outpatient Encounter Medications as of 08/16/2024  Medication Sig   Cholecalciferol  (VITAMIN D3) 50 MCG (2000 UT) capsule Take 1 capsule (2,000 Units total) by mouth daily.   fexofenadine (ALLEGRA) 180 MG tablet Take 180 mg by mouth daily as needed.    lansoprazole  (PREVACID ) 15 MG capsule Take 1 capsule (15 mg total) by mouth daily as needed.   levothyroxine  (SYNTHROID ) 88 MCG tablet Take 1 tablet (88 mcg total) by mouth daily.   meloxicam  (MOBIC ) 15 MG tablet TAKE 1 TABLET BY MOUTH EVERY DAY WITH FOOD   metFORMIN  (GLUCOPHAGE -XR) 500 MG 24 hr tablet Take 1 tablet (500 mg total) by mouth daily with breakfast.   simvastatin  (ZOCOR ) 20 MG tablet TAKE 1 TABLET BY MOUTH EVERYDAY AT BEDTIME   No facility-administered encounter medications on file as of 08/16/2024.   Hearing/Vision screen Vision Screening - Comments:: UTD w/visits to [employer] Immunizations and Health Maintenance Health Maintenance  Topic Date Due   DTaP/Tdap/Td (2 - Td or Tdap) 11/30/2023   Influenza Vaccine  04/27/2024   Medicare Annual Wellness (AWV)  08/14/2024   Diabetic kidney evaluation - eGFR measurement  09/18/2024   OPHTHALMOLOGY EXAM  10/30/2024   HEMOGLOBIN A1C  12/02/2024   Diabetic kidney evaluation - Urine ACR  01/25/2025   Mammogram  05/15/2025   FOOT EXAM  06/04/2025   Colonoscopy  02/02/2026   Cervical Cancer Screening (HPV/Pap Cotest)  05/16/2028   Pneumococcal Vaccine: 50+ Years  Completed   Bone Density Scan  Completed   Hepatitis C Screening  Completed   Zoster Vaccines- Shingrix  Completed   Meningococcal B Vaccine  Aged Out   COVID-19 Vaccine  Discontinued        Assessment/Plan:  This is a routine wellness examination for Lilliann.  Patient Care Team: Cleatus Arlyss RAMAN, MD as PCP - General (Family Medicine) Twylla Glendia BROCKS, MD (Urology) Latisha Medford, MD as Consulting Physician (Obstetrics and Gynecology) [facility]  I have personally reviewed and noted the following in the patient's chart:   Medical and social history Use of alcohol, tobacco or  illicit drugs  Current medications and supplements including opioid prescriptions. Functional ability and status Nutritional status Physical activity Advanced directives List of other physicians Hospitalizations, surgeries, and ER visits in previous 12  months Vitals Screenings to include cognitive, depression, and falls Referrals and appointments  No orders of the defined types were placed in this encounter.  In addition, I have reviewed and discussed with patient certain preventive protocols, quality metrics, and best practice recommendations. A written personalized care plan for preventive services as well as general preventive health recommendations were provided to patient.   Erminio LITTIE Saris, LPN   [id number]   Pt has upcoming Lab (12/25) and CPE (1/26) visit   After Visit Summary: (MyChart) Due to this being a telephonic visit, the after visit summary with patients personalized plan was offered to patient via MyChart   Nurse Notes: Pt has no concerns or questions. AWV/CPE to be made simultaneously in 2027

## 2024-08-16 NOTE — Patient Instructions (Signed)
 Sharon Benjamin,  Thank you for taking the time for your Medicare Wellness Visit. I appreciate your continued commitment to your health goals. Please review the care plan we discussed, and feel free to reach out if I can assist you further.  Please note that Annual Wellness Visits do not include a physical exam. Some assessments may be limited, especially if the visit was conducted virtually. If needed, we may recommend an in-person follow-up with your provider.  Ongoing Care Seeing your primary care provider every 3 to 6 months helps us  monitor your health and provide consistent, personalized care.   Referrals If a referral was made during today's visit and you haven't received any updates within two weeks, please contact the referred provider directly to check on the status.  Recommended Screenings:  Health Maintenance  Topic Date Due   DTaP/Tdap/Td vaccine (2 - Td or Tdap) 11/30/2023   Flu Shot  04/27/2024   Medicare Annual Wellness Visit  08/14/2024   Yearly kidney function blood test for diabetes  09/18/2024   Eye exam for diabetics  10/30/2024   Hemoglobin A1C  12/02/2024   Yearly kidney health urinalysis for diabetes  01/25/2025   Breast Cancer Screening  05/15/2025   Complete foot exam   06/04/2025   Colon Cancer Screening  02/02/2026   Pap with HPV screening  05/16/2028   Pneumococcal Vaccine for age over 42  Completed   Osteoporosis screening with Bone Density Scan  Completed   Hepatitis C Screening  Completed   Zoster (Shingles) Vaccine  Completed   Meningitis B Vaccine  Aged Out   COVID-19 Vaccine  Discontinued       08/16/2024   10:56 AM  Advanced Directives  Does Patient Have a Medical Advance Directive? Yes  Type of Estate Agent of [place];Living will  Copy of Healthcare Power of Attorney in Chart? No - copy requested    Vision: Annual vision screenings are recommended for early detection of glaucoma, cataracts, and diabetic retinopathy.  These exams can also reveal signs of chronic conditions such as diabetes and high blood pressure.  Dental: Annual dental screenings help detect early signs of oral cancer, gum disease, and other conditions linked to overall health, including heart disease and diabetes.

## 2024-08-21 ENCOUNTER — Ambulatory Visit (INDEPENDENT_AMBULATORY_CARE_PROVIDER_SITE_OTHER)

## 2024-08-21 DIAGNOSIS — Z23 Encounter for immunization: Secondary | ICD-10-CM

## 2024-08-27 ENCOUNTER — Other Ambulatory Visit: Payer: Self-pay | Admitting: Family Medicine

## 2024-08-27 DIAGNOSIS — E785 Hyperlipidemia, unspecified: Secondary | ICD-10-CM

## 2024-08-27 DIAGNOSIS — E890 Postprocedural hypothyroidism: Secondary | ICD-10-CM

## 2024-08-27 DIAGNOSIS — E119 Type 2 diabetes mellitus without complications: Secondary | ICD-10-CM

## 2024-08-29 NOTE — Telephone Encounter (Signed)
 Meloxicam  Last filled:  06/01/24, #90 Last OV:  06/04/24, 4 mo DM f/u Next OV:  10/01/24, annual exam

## 2024-09-03 NOTE — Progress Notes (Signed)
 Sharon Benjamin                                          MRN: [id number]   09/03/2024   The [facility] Quality Team Specialist reviewed this patient medical record for the purposes of chart review for care gap closure. The following were reviewed: chart review for care gap closure-kidney health evaluation for diabetes:eGFR .    VBCI Quality Team

## 2024-09-07 ENCOUNTER — Ambulatory Visit: Payer: Self-pay | Admitting: General Practice

## 2024-09-07 ENCOUNTER — Encounter: Payer: Self-pay | Admitting: General Practice

## 2024-09-07 ENCOUNTER — Ambulatory Visit: Admitting: General Practice

## 2024-09-07 VITALS — BP 136/78 | HR 89 | Temp 98.0°F | Ht 65.0 in | Wt 226.0 lb

## 2024-09-07 DIAGNOSIS — J208 Acute bronchitis due to other specified organisms: Secondary | ICD-10-CM

## 2024-09-07 DIAGNOSIS — B9689 Other specified bacterial agents as the cause of diseases classified elsewhere: Secondary | ICD-10-CM

## 2024-09-07 DIAGNOSIS — R051 Acute cough: Secondary | ICD-10-CM

## 2024-09-07 LAB — POCT INFLUENZA A/B
Influenza A, POC: NEGATIVE
Influenza B, POC: NEGATIVE

## 2024-09-07 LAB — POC COVID19 BINAXNOW: SARS Coronavirus 2 Ag: NEGATIVE

## 2024-09-07 MED ORDER — PREDNISONE 20 MG PO TABS: 40.0000 mg | ORAL_TABLET | Freq: Every day | ORAL | 0 refills | Status: AC

## 2024-09-07 MED ORDER — BENZONATATE 200 MG PO CAPS: 200.0000 mg | ORAL_CAPSULE | Freq: Three times a day (TID) | ORAL | 0 refills | Status: AC | PRN

## 2024-09-07 MED ORDER — AZITHROMYCIN 250 MG PO TABS: ORAL_TABLET | ORAL | 0 refills | Status: DC

## 2024-09-07 MED ORDER — PROMETHAZINE-DM 6.25-15 MG/5ML PO SYRP: 5.0000 mL | ORAL_SOLUTION | Freq: Four times a day (QID) | ORAL | 0 refills | Status: AC | PRN

## 2024-09-07 NOTE — Progress Notes (Signed)
 Established Patient Office Visit  Subjective   Patient ID: Sharon Benjamin, female    DOB: Sep 10, 1956  Age: 68 y.o. MRN: [id number]  Chief Complaint  Patient presents with   Cough    With Fever, sneezing,congestion, chills and runny nose since Sunday night. Patient has been using netty pot, taking cough syrup, tylenol  and advil cold and sinus for sx. Has not done any testing at time.     Cough Associated symptoms include chills, a fever, a sore throat and wheezing. Pertinent negatives include no chest pain, ear pain, headaches, heartburn or shortness of breath.    Sharon Benjamin is a 68 year old female, patient of Dr. Cleatus, presents today for an acute visit.   Discussed the use of AI scribe software for clinical note transcription with the patient, who gave verbal consent to proceed.  History of Present Illness Sharon Benjamin is a 68 year old female who presents with fever, cough, and respiratory symptoms.  Her symptoms began on Sunday night with a fever, which she did not start measuring until Wednesday night, with the highest recorded temperature being 100.68F. She experiences sneezing, congestion, chills, rhinorrhea, fatigue, and a sore throat, which she attributes to coughing.   She denies ear pain, nausea, vomiting, diarrhea, urinary symptoms, dizziness, headaches, and significant sinus pain or pressure. She reports some wheezing and a sensation of a 'frog in the throat' that she cannot cough up. Her condition has remained consistent since the onset of symptoms on Sunday.  She has tried over-the-counter medications including cough syrup, Tylenol , Advair, and sinus medications, but has not used Zyrtec or Claritin. She mentions having post-nasal drip, which has mostly resolved.  She is a non-smoker, has received her flu shot this season, does not receive COVID vaccinations, and has not been around anyone known to be sick.     Patient Active Problem List   Diagnosis  Date Noted   Grief 09/05/2022   Vitamin D  deficiency 09/05/2022   Fatty liver 09/05/2022   Right wrist pain 09/05/2022   Diabetes mellitus without complication (HCC) 09/03/2022   Acne rosacea 09/08/2021   Benign neoplasm of skin of lower limb 09/08/2021   Melanocytic nevi of right upper limb, including shoulder 09/08/2021   Melanocytic nevi of trunk 09/08/2021   Gallstones 09/02/2021   Nephrolithiasis 09/02/2021   GERD (gastroesophageal reflux disease) 09/02/2021   Atrophic vaginitis 08/06/2021   Reactive depression (situational) 08/06/2021   Healthcare maintenance 08/13/2020   Shoulder pain 09/19/2019   Knee pain 07/27/2017   Hypothyroidism following radioiodine therapy 05/09/2016   Advance care planning 01/05/2015   Medicare welcome exam 11/30/2013   Irritability 11/30/2013   Obesity (BMI 30-39.9) 12/07/2012   Fibroids 12/06/2012   Multiple thyroid  nodules 08/30/2012   HLD (hyperlipidemia) 07/26/2007   Past Medical History:  Diagnosis Date   Allergy    Arthritis    Cataract    bi;lateral   Diabetes mellitus without complication (HCC)    Diverticulosis of colon    Gallstones    GERD (gastroesophageal reflux disease)    Hyperlipidemia    Insomnia    with prn ambien use, rare as of 2013   Nephrolithiasis    Thyroid  disease    Past Surgical History:  Procedure Laterality Date   Abd sonogram - left renal cyst, GB polyp  03/96   ABDOMINAL HYSTERECTOMY     CATARACT EXTRACTION Bilateral    COLONOSCOPY     CT abd - left renal cyst  only  03/96   Left knee arthroscopy  02/2005   TONSILLECTOMY AND ADENOIDECTOMY  Age 41   UGI - hiatal hernia  03/96   Allergies[1]       09/07/2024   10:56 AM 08/16/2024   10:53 AM 06/04/2024   10:55 AM  Depression screen PHQ 2/9  Decreased Interest 2 0 2  Down, Depressed, Hopeless 1 1 2   PHQ - 2 Score 3 1 4   Altered sleeping 3  3  Tired, decreased energy 2  2  Change in appetite 2  0  Feeling bad or failure about yourself  0  0   Trouble concentrating 1  1  Moving slowly or fidgety/restless 0  0  Suicidal thoughts 0  0  PHQ-9 Score 11  10   Difficult doing work/chores Somewhat difficult  Somewhat difficult     Data saved with a previous flowsheet row definition       09/07/2024   10:56 AM 06/04/2024   10:55 AM 03/15/2023   10:35 AM 12/13/2022   10:32 AM  GAD 7 : Generalized Anxiety Score  Nervous, Anxious, on Edge 0 1 1 3   Control/stop worrying 1 1 1 3   Worry too much - different things 1 1 1 2   Trouble relaxing 0 0 0 0  Restless 0 0 0 0  Easily annoyed or irritable 0 0 1 1  Afraid - awful might happen 0  3 3  Total GAD 7 Score 2  7 12   Anxiety Difficulty Somewhat difficult Somewhat difficult Somewhat difficult Very difficult      Review of Systems  Constitutional:  Positive for chills, fever and malaise/fatigue.  HENT:  Positive for congestion and sore throat. Negative for ear pain.   Respiratory:  Positive for cough and wheezing. Negative for sputum production and shortness of breath.   Cardiovascular:  Negative for chest pain.  Gastrointestinal:  Negative for abdominal pain, constipation, diarrhea, heartburn, nausea and vomiting.  Genitourinary:  Negative for dysuria, frequency and urgency.  Neurological:  Negative for dizziness and headaches.  Endo/Heme/Allergies:  Negative for polydipsia.  Psychiatric/Behavioral:  Negative for depression and suicidal ideas. The patient is not nervous/anxious.       Objective:     BP 136/78   Pulse 89   Temp 98 F (36.7 C) (Temporal)   Ht 5' 5 (1.651 m)   Wt 226 lb (102.5 kg)   SpO2 98%   BMI 37.61 kg/m  BP Readings from Last 3 Encounters:  09/07/24 136/78  06/04/24 138/84  01/26/24 130/80   Wt Readings from Last 3 Encounters:  09/07/24 226 lb (102.5 kg)  08/16/24 225 lb (102.1 kg)  06/04/24 225 lb 3.2 oz (102.2 kg)      Physical Exam Vitals and nursing note reviewed.  Constitutional:      Appearance: She is ill-appearing.  HENT:      Right Ear: Tympanic membrane, ear canal and external ear normal.     Left Ear: Tympanic membrane, ear canal and external ear normal.     Nose: Congestion and rhinorrhea present.     Right Sinus: No maxillary sinus tenderness or frontal sinus tenderness.     Left Sinus: No maxillary sinus tenderness or frontal sinus tenderness.     Mouth/Throat:     Pharynx: Posterior oropharyngeal erythema and postnasal drip present.  Eyes:     Conjunctiva/sclera: Conjunctivae normal.  Cardiovascular:     Rate and Rhythm: Normal rate and regular rhythm.  Pulses: Normal pulses.     Heart sounds: Normal heart sounds.  Pulmonary:     Effort: Pulmonary effort is normal.     Breath sounds: Normal breath sounds.  Neurological:     Mental Status: She is alert and oriented to person, place, and time.  Psychiatric:        Mood and Affect: Mood normal.        Behavior: Behavior normal.        Thought Content: Thought content normal.        Judgment: Judgment normal.      Results for orders placed or performed in visit on 09/07/24  POCT Influenza A/B  Result Value Ref Range   Influenza A, POC Negative Negative   Influenza B, POC Negative Negative  POC COVID-19  Result Value Ref Range   SARS Coronavirus 2 Ag Negative Negative       The 10-year ASCVD risk score (Arnett DK, et al., 2019) is: 20.8%    Assessment & Plan:  Acute cough -     POCT Influenza A/B -     POC COVID-19 BinaxNow -     Benzonatate ; Take 1 capsule (200 mg total) by mouth 3 (three) times daily as needed.  Dispense: 20 capsule; Refill: 0 -     Promethazine-DM; Take 5 mLs by mouth 4 (four) times daily as needed.  Dispense: 118 mL; Refill: 0  Acute bacterial bronchitis -     predniSONE; Take 2 tablets (40 mg total) by mouth daily for 5 days.  Dispense: 10 tablet; Refill: 0 -     Azithromycin ; Take 2 tablets by mouth today, then 1 tablet daily for 4 additional days.  Dispense: 6 tablet; Refill: 0    Assessment and  Plan Assessment & Plan Acute bacterial bronchitis COVID and flu tests negative today. Wheezing present. Presentation and pending viral tests suggest bacterial bronchitis. - Prescribed prednisone, two tablets in the morning for five days. - Prescribed Tessalon  Perles for cough, up to three times a day. - Prescribed azithromycin  (Z-Pak), two tablets today and one tablet daily until finished. - Prescribed Promethazine-DM 5 ml at bedtime. Rx sent. - Advised to monitor for chest pain, shortness of breath, or difficulty breathing and go to the ER if these occur. - If not improved after treatment, consider chest x-ray.    Return if symptoms worsen or fail to improve.    Carrol Aurora, NP    [1]  Allergies Allergen Reactions   Lipitor [Atorvastatin]     Aches.  Did tolerate simvastatin .     Metformin  And Related     Diarrhea with 1000mg  dose.  Can tolerate 500mg  dose.

## 2024-09-07 NOTE — Patient Instructions (Addendum)
 Start Azithromycin  antibiotics for infection. Take 2 tablets by mouth today, then 1 tablet daily for 4 additional days.   Start Benzonatate  capsules for cough. Take 1 capsule by mouth three times daily as needed for cough.   Start prednisone 20 mg tablets. Take 2 tablets by mouth once daily in the morning for 5 days.   Start Promethazine - DM cough syrup 5 ml at bedtime for cough. This can make you drowsy.   Increase fluid intake, rest, use humidifier.   You can try a few things over the counter to help with your symptoms including:  Cough: Delsym or Robitussin (get the off brand, works just as well) Chest Congestion: Mucinex (plain) Nasal Congestion/Ear Pressure/Sinus Pressure: Try using Flonase (fluticasone) nasal spray. Instill 1 spray in each nostril twice daily. This can be purchased over the counter. Body aches, fevers, headache: Ibuprofen (not to exceed 2400 mg in 24 hours) or Acetaminophen -Tylenol  (not to exceed 3000 mg in 24 hours) Runny Nose/Throat Drainage/Sneezing/Itchy or Watery Eyes: An antihistamine such as Zyrtec, Claritin, Xyzal, Allegra  Update me if your symptoms worsen or do not improve.   It was a pleasure meeting you!

## 2024-09-18 NOTE — Progress Notes (Signed)
 Sharon Benjamin                                          MRN: [id number]   09/18/2024   The [facility] Quality Team Specialist reviewed this patient medical record for the purposes of chart review for care gap closure. The following were reviewed: chart review for care gap closure-kidney health evaluation for diabetes:eGFR .    [facility] Quality Team

## 2024-09-19 ENCOUNTER — Other Ambulatory Visit: Payer: Self-pay | Admitting: Family Medicine

## 2024-09-19 DIAGNOSIS — E890 Postprocedural hypothyroidism: Secondary | ICD-10-CM

## 2024-09-19 DIAGNOSIS — E119 Type 2 diabetes mellitus without complications: Secondary | ICD-10-CM

## 2024-09-19 DIAGNOSIS — E785 Hyperlipidemia, unspecified: Secondary | ICD-10-CM

## 2024-09-24 ENCOUNTER — Other Ambulatory Visit (INDEPENDENT_AMBULATORY_CARE_PROVIDER_SITE_OTHER)

## 2024-09-24 DIAGNOSIS — E119 Type 2 diabetes mellitus without complications: Secondary | ICD-10-CM

## 2024-09-24 DIAGNOSIS — E785 Hyperlipidemia, unspecified: Secondary | ICD-10-CM | POA: Diagnosis not present

## 2024-09-24 DIAGNOSIS — E890 Postprocedural hypothyroidism: Secondary | ICD-10-CM | POA: Diagnosis not present

## 2024-09-24 LAB — COMPREHENSIVE METABOLIC PANEL WITH GFR
ALT: 25 U/L (ref 3–35)
AST: 19 U/L (ref 5–37)
Albumin: 4.2 g/dL (ref 3.5–5.2)
Alkaline Phosphatase: 93 U/L (ref 39–117)
BUN: 20 mg/dL (ref 6–23)
CO2: 31 meq/L (ref 19–32)
Calcium: 9.4 mg/dL (ref 8.4–10.5)
Chloride: 101 meq/L (ref 96–112)
Creatinine, Ser: 0.84 mg/dL (ref 0.40–1.20)
GFR: 70.59 mL/min
Glucose, Bld: 198 mg/dL — ABNORMAL HIGH (ref 70–99)
Potassium: 4.4 meq/L (ref 3.5–5.1)
Sodium: 141 meq/L (ref 135–145)
Total Bilirubin: 0.6 mg/dL (ref 0.2–1.2)
Total Protein: 6.4 g/dL (ref 6.0–8.3)

## 2024-09-24 LAB — LIPID PANEL
Cholesterol: 180 mg/dL (ref 28–200)
HDL: 40.8 mg/dL
LDL Cholesterol: 98 mg/dL (ref 10–99)
NonHDL: 139.43
Total CHOL/HDL Ratio: 4
Triglycerides: 209 mg/dL — ABNORMAL HIGH (ref 10.0–149.0)
VLDL: 41.8 mg/dL — ABNORMAL HIGH (ref 0.0–40.0)

## 2024-09-24 LAB — HEMOGLOBIN A1C: Hgb A1c MFr Bld: 7.6 % — ABNORMAL HIGH (ref 4.6–6.5)

## 2024-09-24 LAB — VITAMIN D 25 HYDROXY (VIT D DEFICIENCY, FRACTURES): VITD: 28.99 ng/mL — ABNORMAL LOW (ref 30.00–100.00)

## 2024-09-24 LAB — TSH: TSH: 3.59 u[IU]/mL (ref 0.35–5.50)

## 2024-09-26 ENCOUNTER — Ambulatory Visit: Payer: Self-pay

## 2024-09-26 NOTE — Telephone Encounter (Signed)
Will see then. 

## 2024-09-26 NOTE — Telephone Encounter (Signed)
 Message from Sharon Benjamin sent at 09/26/2024  9:21 AM EST  Reason for Triage: patient stated she was told to call if her congestion did not clear up to come In. Please advise [phone]

## 2024-09-26 NOTE — Telephone Encounter (Signed)
 I spoke with pt; pt last seen 12 /12/25. Pt finished zpak and prednisone  and pts symptoms cleared. On 09/25/24 pt started with dry cough and chest tightness. No SOB or CP and no fever or wheezing. Pt wanted to schedule appt. No available appts at [facility] offices this AM.pt scheduled appt with Dr KANDICE on 09/29/23 at 11:30 with [facility] ED precautions and pt voiced understanding. Sending note to Dr KANDICE and G pool.

## 2024-09-28 ENCOUNTER — Ambulatory Visit
Admission: RE | Admit: 2024-09-28 | Discharge: 2024-09-28 | Disposition: A | Discharge status: Home / Self Care | Source: Ambulatory Visit | Attending: Family Medicine | Admitting: Family Medicine

## 2024-09-28 ENCOUNTER — Ambulatory Visit: Admitting: Family Medicine

## 2024-09-28 ENCOUNTER — Encounter: Payer: Self-pay | Admitting: Family Medicine

## 2024-09-28 VITALS — BP 148/90 | HR 87 | Temp 98.2°F | Ht 65.0 in | Wt 226.6 lb

## 2024-09-28 DIAGNOSIS — R051 Acute cough: Secondary | ICD-10-CM | POA: Diagnosis not present

## 2024-09-28 DIAGNOSIS — R509 Fever, unspecified: Secondary | ICD-10-CM

## 2024-09-28 DIAGNOSIS — J101 Influenza due to other identified influenza virus with other respiratory manifestations: Secondary | ICD-10-CM

## 2024-09-28 LAB — POCT INFLUENZA A/B
Influenza A, POC: POSITIVE — AB
Influenza B, POC: NEGATIVE

## 2024-09-28 MED ORDER — ALBUTEROL SULFATE HFA 108 (90 BASE) MCG/ACT IN AERS: 2.0000 | INHALATION_SPRAY | Freq: Four times a day (QID) | RESPIRATORY_TRACT | 0 refills | Status: AC | PRN

## 2024-09-28 MED ORDER — OSELTAMIVIR PHOSPHATE 75 MG PO CAPS: 75.0000 mg | ORAL_CAPSULE | Freq: Two times a day (BID) | ORAL | 0 refills | Status: AC

## 2024-09-28 NOTE — Assessment & Plan Note (Addendum)
 Flu swab faintly positive today, with recent flu exposure.  Past ideal window for tamiflu but given age/comorbidities (diabetic) will Rx tamiflu. Reviewed possible side effects. Continue tessalon . Albuterol  rescue inh PRN wheeze. Update if not improving with treatment. Pt agrees with plan.  Given abnormal LLL breath sounds - check CXR - clear on my read. Await radiology final read.

## 2024-09-28 NOTE — Patient Instructions (Addendum)
 Flu A positive today Treat with tamiflu sent to pharmacy  Let us  know if not improving with treatment.  Ok to continue tessalon  perls.  Albuterol  inhaler as needed for chest tightness/wheezing.

## 2024-09-28 NOTE — Progress Notes (Signed)
 " Ph: [phone] Fax: [phone]   Patient ID: Sharon Benjamin, female    DOB: 1956/01/04, 69 y.o.   MRN: [id number]  This visit was conducted in person.  BP (!) 148/90 (Cuff Size: Normal)   Pulse 87   Temp 98.2 F (36.8 C) (Oral)   Ht 5' 5 (1.651 m)   Wt 226 lb 9.6 oz (102.8 kg)   SpO2 95%   BMI 37.71 kg/m   BP Readings from Last 3 Encounters:  09/28/24 (!) 148/90  09/07/24 136/78  06/04/24 138/84   CC: recurrent symptoms  Subjective:   HPI: Sharon Benjamin is a 69 y.o. female presenting on 09/28/2024 for Acute Visit (Dry cough, wheezing, fever, headache, chillls, leg aches, feels heavy' on her chest onset 12/30/Nephew has Flu, just saw him this Sunday 12/28/Was feeling better since being seen 12/12 by B[place]en but then states she fell sick again)   Seen 09/07/2024 in office with dx acute bronchitis treated with azithromycin  and prednisone  40mg  burst. Covid and flu swabs negative at that time. Symptoms improved.   Now 3d ago developed recurrent dry cough, chest tightness, wheezing, headache and body aches and fever Tmax 100s. No associated dyspnea, ST, ear pain.   Treated at home with mucinex, tylenol , neti pot.   Nephew recently diagnosed with flu. He is on chemo, has been hospitalized due to illness.   No h/o asthma Non smoker.   Diabetic on metformin  one a day - she notes sugars did go up with recent prednisone  course. Lab Results  Component Value Date   HGBA1C 7.6 (H) 09/24/2024   --> in setting of steroid course.   Husband passed away 1 year ago - difficult time for her.      Relevant past medical, surgical, family and social history reviewed and updated as indicated. Interim medical history since our last visit reviewed. Allergies and medications reviewed and updated. Outpatient Medications Prior to Visit  Medication Sig Dispense Refill   benzonatate  (TESSALON ) 200 MG capsule Take 1 capsule (200 mg total) by mouth 3 (three) times daily as needed. 20  capsule 0   Cholecalciferol (VITAMIN D3) 50 MCG (2000 UT) capsule Take 1 capsule (2,000 Units total) by mouth daily.     fexofenadine (ALLEGRA) 180 MG tablet Take 180 mg by mouth daily as needed.      lansoprazole  (PREVACID ) 15 MG capsule Take 1 capsule (15 mg total) by mouth daily as needed.     levothyroxine  (SYNTHROID ) 88 MCG tablet TAKE 1 TABLET BY MOUTH EVERY DAY 90 tablet 0   meloxicam  (MOBIC ) 15 MG tablet TAKE 1 TABLET BY MOUTH EVERY DAY WITH FOOD 90 tablet 3   metFORMIN  (GLUCOPHAGE -XR) 500 MG 24 hr tablet TAKE 1 TABLET BY MOUTH EVERY DAY WITH BREAKFAST 90 tablet 0   promethazine -dextromethorphan (PROMETHAZINE -DM) 6.25-15 MG/5ML syrup Take 5 mLs by mouth 4 (four) times daily as needed. 118 mL 0   simvastatin  (ZOCOR ) 20 MG tablet TAKE 1 TABLET BY MOUTH EVERYDAY AT BEDTIME 90 tablet 0   azithromycin  (ZITHROMAX ) 250 MG tablet Take 2 tablets by mouth today, then 1 tablet daily for 4 additional days. (Patient not taking: Reported on 09/28/2024) 6 tablet 0   No facility-administered medications prior to visit.     Per HPI unless specifically indicated in ROS section below Review of Systems  Objective:  BP (!) 148/90 (Cuff Size: Normal)   Pulse 87   Temp 98.2 F (36.8 C) (Oral)   Ht 5' 5 (1.651 m)  Wt 226 lb 9.6 oz (102.8 kg)   SpO2 95%   BMI 37.71 kg/m   Wt Readings from Last 3 Encounters:  09/28/24 226 lb 9.6 oz (102.8 kg)  09/07/24 226 lb (102.5 kg)  08/16/24 225 lb (102.1 kg)      Physical Exam Vitals and nursing note reviewed.  Constitutional:      Appearance: Normal appearance. She is not ill-appearing.  HENT:     Head: Normocephalic and atraumatic.     Right Ear: Tympanic membrane, ear canal and external ear normal. There is no impacted cerumen.     Left Ear: Tympanic membrane, ear canal and external ear normal. There is no impacted cerumen.     Nose: Nose normal. No congestion or rhinorrhea.     Mouth/Throat:     Mouth: Mucous membranes are moist.     Pharynx:  Oropharynx is clear. No oropharyngeal exudate or posterior oropharyngeal erythema.  Eyes:     Extraocular Movements: Extraocular movements intact.     Conjunctiva/sclera: Conjunctivae normal.     Pupils: Pupils are equal, round, and reactive to light.  Cardiovascular:     Rate and Rhythm: Normal rate and regular rhythm.     Pulses: Normal pulses.     Heart sounds: Normal heart sounds. No murmur heard. Pulmonary:     Effort: Pulmonary effort is normal. No respiratory distress.     Breath sounds: Normal breath sounds. No wheezing or rhonchi.     Comments:  Deep persistent cough present  LLL crackles  Lymphadenopathy:     Head:     Right side of head: No submental, submandibular, tonsillar, preauricular or posterior auricular adenopathy.     Left side of head: No submental, submandibular, tonsillar, preauricular or posterior auricular adenopathy.     Cervical: No cervical adenopathy.     Right cervical: No superficial cervical adenopathy.    Left cervical: No superficial cervical adenopathy.     Upper Body:     Right upper body: No supraclavicular adenopathy.     Left upper body: No supraclavicular adenopathy.  Skin:    Findings: No rash.  Neurological:     Mental Status: She is alert.  Psychiatric:        Mood and Affect: Mood normal.        Behavior: Behavior normal.       Results for orders placed or performed in visit on 09/28/24  POCT Influenza A/B   Collection Time: 09/28/24 12:40 PM  Result Value Ref Range   Influenza A, POC Positive (A) Negative   Influenza B, POC Negative Negative   Assessment & Plan:   Problem List Items Addressed This Visit     Influenza A - Primary   Flu swab faintly positive today, with recent flu exposure.  Past ideal window for tamiflu but given age/comorbidities (diabetic) will Rx tamiflu. Reviewed possible side effects. Continue tessalon . Albuterol  rescue inh PRN wheeze. Update if not improving with treatment. Pt agrees with plan.   Given abnormal LLL breath sounds - check CXR - clear on my read. Await radiology final read.       Relevant Medications   oseltamivir (TAMIFLU) 75 MG capsule   Other Visit Diagnoses       Acute cough       Relevant Orders   DG Chest 2 View     Fever, unspecified fever cause       Relevant Orders   POCT Influenza A/B (Completed)   DG Chest 2  View        Meds ordered this encounter  Medications   oseltamivir (TAMIFLU) 75 MG capsule    Sig: Take 1 capsule (75 mg total) by mouth 2 (two) times daily.    Dispense:  10 capsule    Refill:  0   albuterol  (VENTOLIN  HFA) 108 (90 Base) MCG/ACT inhaler    Sig: Inhale 2 puffs into the lungs every 6 (six) hours as needed for wheezing or shortness of breath.    Dispense:  8 g    Refill:  0    Orders Placed This Encounter  Procedures   DG Chest 2 View    Standing Status:   Future    Number of Occurrences:   1    Expiration Date:   09/28/2025    Reason for Exam (SYMPTOM  OR DIAGNOSIS REQUIRED):   cough, LLL crackles, fever    Preferred imaging location?:   [place] [facility]   POCT Influenza A/B    Patient Instructions  Flu A positive today Treat with tamiflu sent to pharmacy  Let us  know if not improving with treatment.  Ok to continue tessalon  perls.  Albuterol  inhaler as needed for chest tightness/wheezing.   Follow up plan: Return if symptoms worsen or fail to improve.  Anton Blas, MD   "

## 2024-09-30 ENCOUNTER — Ambulatory Visit: Payer: Self-pay | Admitting: Family Medicine

## 2024-10-01 ENCOUNTER — Encounter: Payer: Self-pay | Admitting: Family Medicine

## 2024-10-01 ENCOUNTER — Ambulatory Visit: Admitting: Family Medicine

## 2024-10-01 VITALS — BP 122/62 | HR 90 | Temp 98.5°F | Ht 64.5 in | Wt 226.5 lb

## 2024-10-01 DIAGNOSIS — E559 Vitamin D deficiency, unspecified: Secondary | ICD-10-CM | POA: Diagnosis not present

## 2024-10-01 DIAGNOSIS — F4321 Adjustment disorder with depressed mood: Secondary | ICD-10-CM

## 2024-10-01 DIAGNOSIS — Z7189 Other specified counseling: Secondary | ICD-10-CM

## 2024-10-01 DIAGNOSIS — J101 Influenza due to other identified influenza virus with other respiratory manifestations: Secondary | ICD-10-CM | POA: Diagnosis not present

## 2024-10-01 DIAGNOSIS — E119 Type 2 diabetes mellitus without complications: Secondary | ICD-10-CM

## 2024-10-01 DIAGNOSIS — Z0000 Encounter for general adult medical examination without abnormal findings: Secondary | ICD-10-CM

## 2024-10-01 DIAGNOSIS — E890 Postprocedural hypothyroidism: Secondary | ICD-10-CM

## 2024-10-01 DIAGNOSIS — E785 Hyperlipidemia, unspecified: Secondary | ICD-10-CM | POA: Diagnosis not present

## 2024-10-01 MED ORDER — METFORMIN HCL ER 500 MG PO TB24: 500.0000 mg | ORAL_TABLET | Freq: Every day | ORAL | 3 refills | Status: AC

## 2024-10-01 MED ORDER — VITAMIN D3 50 MCG (2000 UT) PO CAPS: 4000.0000 [IU] | ORAL_CAPSULE | Freq: Every day | ORAL | Status: AC

## 2024-10-01 MED ORDER — SIMVASTATIN 20 MG PO TABS: ORAL_TABLET | ORAL | 3 refills | Status: AC

## 2024-10-01 MED ORDER — LEVOTHYROXINE SODIUM 88 MCG PO TABS: 88.0000 ug | ORAL_TABLET | Freq: Every day | ORAL | 3 refills | Status: AC

## 2024-10-01 NOTE — Progress Notes (Signed)
 Recent flu dx, on tamiflu .  She feels some better.  No fevers now.  Still fatigued.  Not having diffuse aches. Still with some cough but better.    Diabetes:  Using medications without difficulties: rare diarrhea with metformin  XR.  She can tolerate as is.   Hypoglycemic episodes: no sx  Hyperglycemic episodes: no sx Feet problems: no Blood Sugars averaging: not checked.   eye exam within last year:yes, has f/u pending.    Hypothyroidism.  Compliant.  TSH wnl.  Labs d/w pt.   D/w pt about grief at holidays with prior loss of husband.     Elevated Cholesterol: Using medications without problems: yes Muscle aches: no Diet compliance: d/w pt.   Exercise: d/w pt.     Vit D def d/w pt.  On replacement.  D/w pt about inc to vit d 4000 international units per day.       Flu 2025 Shingles previously done PNA 2022 Tetanus 2015 covid vaccine prev done.  Colon cancer screening with colonoscopy 2017 Breast cancer screening per gynecology.   [facility] 20[facility]   Advance directive- Granddaughter Cristopher Bloch designated if patient were incapacitated.   PMH and SH reviewed   Meds, vitals, and allergies reviewed.    ROS: Per HPI unless specifically indicated in ROS section    GEN: nad, alert and oriented HEENT: ncat NECK: supple w/o LA CV: rrr. PULM: ctab, no inc wob, occ cough noted.  ABD: soft, +bs EXT: no edema SKIN: no acute rash

## 2024-10-01 NOTE — Patient Instructions (Addendum)
 Try taking extra vitamin D  and recheck A1c at a visit in about 3-4 months.   Take care.  Glad to see you. Update me as needed.

## 2024-10-07 NOTE — Assessment & Plan Note (Signed)
 Compliant.  TSH wnl.  Labs d/w pt. continue levothyroxine  as is.

## 2024-10-07 NOTE — Assessment & Plan Note (Signed)
 Not completely well but improving, on tamiflu .  She feels some better.  No fevers now.  Still fatigued.  Not having diffuse aches. Still with some cough but better.   Update me as needed.

## 2024-10-07 NOTE — Assessment & Plan Note (Signed)
Support offered.  See above.

## 2024-10-07 NOTE — Assessment & Plan Note (Signed)
 Advance directive- Granddaughter Shelbie Ammons designated if patient were incapacitated.

## 2024-10-07 NOTE — Assessment & Plan Note (Signed)
 Flu 2025 Shingles previously done PNA 2022 Tetanus 2015 covid vaccine prev done.  Colon cancer screening with colonoscopy 2017 Breast cancer screening per gynecology.   Bone density test 20[facility].   Advance directive- Granddaughter Cristopher Bloch designated if patient were incapacitated.

## 2024-10-07 NOTE — Assessment & Plan Note (Signed)
 No change in meds. Plan on recheck A1c at a visit in about 3-4 months.   Continue work on diet and exercise when she is feeling better.

## 2024-10-07 NOTE — Assessment & Plan Note (Signed)
 Continue work on diet and exercise.  Continue simvastatin .

## 2024-10-07 NOTE — Assessment & Plan Note (Signed)
 On replacement.  D/w pt about inc to vit d 4000 international units per day.

## 2024-10-31 LAB — OPHTHALMOLOGY REPORT-SCANNED

## 2025-01-29 ENCOUNTER — Ambulatory Visit: Admitting: Family Medicine

## 2025-09-26 ENCOUNTER — Other Ambulatory Visit

## 2025-10-03 ENCOUNTER — Encounter: Admitting: Family Medicine
